# Patient Record
Sex: Female | Born: 1990 | Race: White | Hispanic: No | Marital: Single | Smoking: Never smoker
Health system: Southern US, Community
[De-identification: ages and names within clinical notes are randomized; demographics above are authoritative.]

## PROBLEM LIST (undated history)

## (undated) DIAGNOSIS — O24419 Gestational diabetes mellitus in pregnancy, unspecified control: Secondary | ICD-10-CM

## (undated) HISTORY — PX: NO PAST SURGERIES: SHX2092

## (undated) HISTORY — DX: Gestational diabetes mellitus in pregnancy, unspecified control: O24.419

---

## 2012-10-22 DIAGNOSIS — O24419 Gestational diabetes mellitus in pregnancy, unspecified control: Secondary | ICD-10-CM

## 2013-06-16 ENCOUNTER — Encounter (INDEPENDENT_AMBULATORY_CARE_PROVIDER_SITE_OTHER): Payer: Self-pay

## 2013-06-16 ENCOUNTER — Ambulatory Visit (INDEPENDENT_AMBULATORY_CARE_PROVIDER_SITE_OTHER): Payer: Medicaid Other

## 2013-06-16 ENCOUNTER — Other Ambulatory Visit: Payer: Self-pay | Admitting: Obstetrics & Gynecology

## 2013-06-16 ENCOUNTER — Other Ambulatory Visit: Payer: Medicaid Other

## 2013-06-16 DIAGNOSIS — O9989 Other specified diseases and conditions complicating pregnancy, childbirth and the puerperium: Secondary | ICD-10-CM

## 2013-06-16 DIAGNOSIS — Z36 Encounter for antenatal screening of mother: Secondary | ICD-10-CM

## 2013-06-16 DIAGNOSIS — N926 Irregular menstruation, unspecified: Secondary | ICD-10-CM

## 2013-06-16 DIAGNOSIS — O3680X0 Pregnancy with inconclusive fetal viability, not applicable or unspecified: Secondary | ICD-10-CM

## 2013-06-16 DIAGNOSIS — O99891 Other specified diseases and conditions complicating pregnancy: Secondary | ICD-10-CM

## 2013-06-16 DIAGNOSIS — Z3400 Encounter for supervision of normal first pregnancy, unspecified trimester: Secondary | ICD-10-CM

## 2013-06-16 NOTE — Progress Notes (Signed)
U/S-single IUP with +FCA noted, FHR-164 bpm, CRL c/w 12/1wks EDD 12/28/2013, cx appears closed, anterior Gr 0 placenta, bilateral adnexa appears WNL, pt desires NT/IT, NB present, NT-1.2941mm

## 2013-06-19 LAB — MATERNAL SCREEN, INTEGRATED #1

## 2013-06-24 ENCOUNTER — Encounter: Payer: Medicaid Other | Admitting: Adult Health

## 2013-07-01 ENCOUNTER — Encounter: Payer: Medicaid Other | Admitting: Women's Health

## 2013-12-29 DIAGNOSIS — O24419 Gestational diabetes mellitus in pregnancy, unspecified control: Secondary | ICD-10-CM

## 2014-04-21 ENCOUNTER — Encounter (HOSPITAL_COMMUNITY): Payer: Self-pay | Admitting: *Deleted

## 2015-10-24 ENCOUNTER — Encounter (HOSPITAL_COMMUNITY): Payer: Self-pay | Admitting: Emergency Medicine

## 2015-10-24 ENCOUNTER — Emergency Department (HOSPITAL_COMMUNITY)
Admission: EM | Admit: 2015-10-24 | Discharge: 2015-10-24 | Disposition: A | Discharge status: Home / Self Care | Payer: Medicaid Other | Attending: Emergency Medicine | Admitting: Emergency Medicine

## 2015-10-24 DIAGNOSIS — L0201 Cutaneous abscess of face: Secondary | ICD-10-CM | POA: Insufficient documentation

## 2015-10-24 DIAGNOSIS — L0291 Cutaneous abscess, unspecified: Secondary | ICD-10-CM

## 2015-10-24 MED ORDER — TRAMADOL HCL 50 MG PO TABS: 50.0000 mg | ORAL_TABLET | Freq: Four times a day (QID) | ORAL | 0 refills | Status: DC | PRN

## 2015-10-24 MED ORDER — SULFAMETHOXAZOLE-TRIMETHOPRIM 800-160 MG PO TABS: 1.0000 | ORAL_TABLET | Freq: Two times a day (BID) | ORAL | 0 refills | Status: AC

## 2015-10-24 NOTE — ED Provider Notes (Signed)
[facility]-EM[facility] DEPT Provider Note   CSN: [id number] Arrival date & time: 10/24/15  1738  First Provider Contact:  First MD Initiated Contact with Patient 10/24/15 1901      By signing my name below, I, [facility], attest that this documentation has been prepared under the direction and in the presence of Burgess Amor, PA-C. Electronically Signed: Randell Patient, ED Scribe. 10/24/15. 7:13 PM.   History   Chief Complaint Chief Complaint  Patient presents with  . Insect Bite    HPI Diane Medina is a 25 y.o. female who presents to the Emergency Department complaining of constant, gradually increasing in redness and swelling, moderately painful abscess just below her left lower lip after a presumptive spider bite that occurred 2 weeks ago. Pt states that she woke a couple weeks ago with a small, erythematous bite to her face for which she was evaluated at [facility] last week, diagnosed with a spider bite that had abscessed, and prescribed a 7-day course of clindamycin. She notes that she finished this course of medication and that over the past week the bump has increased in size, redness, and pain. She reports intermittent HAs and nausea for the past two weeks but not currently. Denies fever or abdominal pain.  The history is provided by the patient. No language interpreter was used.    History reviewed. No pertinent past medical history.  There are no active problems to display for this patient.   History reviewed. No pertinent surgical history.  OB History    Gravida Para Term Preterm AB Living   1             SAB TAB Ectopic Multiple Live Births                   Home Medications    Prior to Admission medications   Medication Sig Start Date End Date Taking? Authorizing Provider  sulfamethoxazole-trimethoprim (BACTRIM DS,SEPTRA DS) 800-160 MG tablet Take 1 tablet by mouth 2 (two) times daily. 10/24/15 10/31/15  Burgess Amor, PA-C  traMADol (ULTRAM) 50 MG tablet  Take 1 tablet (50 mg total) by mouth every 6 (six) hours as needed. 10/24/15   Burgess Amor, PA-C    Family History History reviewed. No pertinent family history.  Social History Social History  Substance Use Topics  . Smoking status: Never Smoker  . Smokeless tobacco: Never Used  . Alcohol use No     Allergies   Penicillins   Review of Systems Review of Systems   Physical Exam Updated Vital Signs BP 107/64 (BP Location: Left Arm)   Pulse 95   Temp 98.4 F (36.9 C) (Oral)   Resp 18   Ht 5\' 6"  (1.676 m)   Wt 113.4 kg   LMP 10/17/2015   SpO2 100%   BMI 40.35 kg/m   Physical Exam  Constitutional: She is oriented to person, place, and time. She appears well-developed and well-nourished. No distress.  HENT:  Head: Normocephalic and atraumatic.  0.75 cm raised pustule beneath her left lower lip. No drainage. No red streaking.  Eyes: Conjunctivae are normal.  Neck: Normal range of motion.  Cardiovascular: Normal rate.   Pulmonary/Chest: Effort normal. No respiratory distress.  Musculoskeletal: Normal range of motion.  Neurological: She is alert and oriented to person, place, and time.  Skin: Skin is warm and dry.  Psychiatric: She has a normal mood and affect. Her behavior is normal.  Nursing note and vitals reviewed.    ED Treatments /  Results  DIAGNOSTIC STUDIES: Oxygen Saturation is 100% on RA, normal by my interpretation.    COORDINATION OF CARE: 7:02 PM Will apply freeze spray and needle aspiration to pustule. Discussed treatment plan with pt at bedside and pt agreed to plan.    Procedures Procedures   INCISION AND DRAINAGE Performed by: Burgess Amor Consent: Verbal consent obtained. Risks and benefits: risks, benefits and alternatives were discussed Type: abscess  Body area:left chin/lower lip Anesthesia: Gebauers Freeze spray  Needle aspiration using #21 gauge needle  Local anesthetic: none Anesthetic total: n/a Complexity: simple Drainage:  purulent  Drainage amount: trace  Packing material: n/a  Patient tolerance: Patient tolerated the procedure well with no immediate complications.     Medications Ordered in ED Medications - No data to display   Initial Impression / Assessment and Plan / ED Course  I have reviewed the triage vital signs and the nursing notes.  Pertinent labs & imaging results that were available during my care of the patient were reviewed by me and considered in my medical decision making (see chart for details).  Clinical Course    Advised to avoid squeezing the site.  Apply warm compresses, bactrim, tramadol prescribed.  Plan f/u with pcp or return here for any worsening conditions.  Findings limited to small pustule/ papular skin infection.    Final Clinical Impressions(s) / ED Diagnoses   Final diagnoses:  Abscess    New Prescriptions Discharge Medication List as of 10/24/2015  7:36 PM    START taking these medications   Details  sulfamethoxazole-trimethoprim (BACTRIM DS,SEPTRA DS) 800-160 MG tablet Take 1 tablet by mouth 2 (two) times daily., Starting Mon 10/24/2015, Until Mon 10/31/2015, Print    traMADol (ULTRAM) 50 MG tablet Take 1 tablet (50 mg total) by mouth every 6 (six) hours as needed., Starting Mon 10/24/2015, Print       I personally performed the services described in this documentation, which was scribed in my presence. The recorded information has been reviewed and is accurate.    Burgess Amor, PA-C 10/26/15 2302    Glynn Octave, MD 10/28/15 [phone]

## 2015-10-24 NOTE — ED Notes (Signed)
Pt verbalized understanding of no driving and to use caution within 4 hours of taking pain meds due to meds cause drowsiness 

## 2015-10-24 NOTE — ED Triage Notes (Signed)
Pt reports was diagnosed with a spider bite and reports was prescribed abx. Pt reports finished abx last Friday. Pt reports site is "swelling more towards my lip." nad noted. Small red raised area noted to lower left side of face. nad noted.

## 2015-10-24 NOTE — Discharge Instructions (Signed)
Continue using warm water soaks as discussed.  Gentle massage around the site can also be helpful while soaking.  Avoid squeezing the site.  You may take the tramadol prescribed for pain relief.  This will make you drowsy - do not drive within 4 hours of taking this medication.

## 2015-12-07 ENCOUNTER — Encounter (HOSPITAL_COMMUNITY): Payer: Self-pay

## 2015-12-07 ENCOUNTER — Emergency Department (HOSPITAL_COMMUNITY)
Admission: EM | Admit: 2015-12-07 | Discharge: 2015-12-07 | Disposition: A | Discharge status: Home / Self Care | Payer: Medicaid Other | Attending: Emergency Medicine | Admitting: Emergency Medicine

## 2015-12-07 ENCOUNTER — Emergency Department (HOSPITAL_COMMUNITY): Payer: Medicaid Other

## 2015-12-07 DIAGNOSIS — Y929 Unspecified place or not applicable: Secondary | ICD-10-CM | POA: Insufficient documentation

## 2015-12-07 DIAGNOSIS — W182XXA Fall in (into) shower or empty bathtub, initial encounter: Secondary | ICD-10-CM | POA: Insufficient documentation

## 2015-12-07 DIAGNOSIS — Y999 Unspecified external cause status: Secondary | ICD-10-CM | POA: Insufficient documentation

## 2015-12-07 DIAGNOSIS — Y939 Activity, unspecified: Secondary | ICD-10-CM | POA: Insufficient documentation

## 2015-12-07 DIAGNOSIS — Z79899 Other long term (current) drug therapy: Secondary | ICD-10-CM | POA: Insufficient documentation

## 2015-12-07 DIAGNOSIS — S60211A Contusion of right wrist, initial encounter: Secondary | ICD-10-CM | POA: Insufficient documentation

## 2015-12-07 DIAGNOSIS — M25531 Pain in right wrist: Secondary | ICD-10-CM

## 2015-12-07 NOTE — ED Triage Notes (Signed)
Slipped and fell in the bathroom this morning and hit my right wrist.  Having pain since this morning and it keeps getting worse.  Any movement or vibration makes it hurt.

## 2015-12-07 NOTE — ED Provider Notes (Signed)
[facility] Provider Note   CSN: [id number] Arrival date & time: 12/07/15  1925     History   Chief Complaint Chief Complaint  Patient presents with  . Wrist Pain    HPI Diane Medina is a 25 y.o. female.  HPI   Patient is a 25 year old female who presents the emergency department with progressively worsening right wrist pain that began this morning around 9 AM after she struck her wrist on her tub. She states constant throbbing pain, 6/10, worse with movement, 8/10. Associated intermittent numbness of her fifth finger and bruising. She has not taken anything for pain. Patient denies fever, weakness.  History reviewed. No pertinent past medical history.  There are no active problems to display for this patient.   History reviewed. No pertinent surgical history.  OB History    Gravida Para Term Preterm AB Living   1             SAB TAB Ectopic Multiple Live Births                   Home Medications    Prior to Admission medications   Medication Sig Start Date End Date Taking? Authorizing Provider  traMADol (ULTRAM) 50 MG tablet Take 1 tablet (50 mg total) by mouth every 6 (six) hours as needed. 10/24/15   Burgess Amor, PA-C    Family History History reviewed. No pertinent family history.  Social History Social History  Substance Use Topics  . Smoking status: Never Smoker  . Smokeless tobacco: Never Used  . Alcohol use No     Allergies   Penicillins   Review of Systems Review of Systems  Constitutional: Negative for fever.  Skin: Positive for color change. Negative for wound.  Neurological: Positive for numbness. Negative for weakness.     Physical Exam Updated Vital Signs BP 115/71 (BP Location: Left Arm)   Pulse 86   Temp 98.4 F (36.9 C) (Oral)   Resp 20   Ht 5\' 5"  (1.651 m)   Wt 108.9 kg   LMP 11/18/2015 (Approximate)   SpO2 100%   Breastfeeding? No   BMI 39.94 kg/m   Physical Exam  Constitutional: She appears well-developed  and well-nourished. No distress.  HENT:  Head: Normocephalic and atraumatic.  Eyes: Conjunctivae are normal.  Cardiovascular:  Pulses:      Radial pulses are 2+ on the right side, and 2+ on the left side.  Pulmonary/Chest: Effort normal. No respiratory distress.  Musculoskeletal: Normal range of motion.  contusions noted to lateral right wrist, no deformity, mild edema. Limited range of motion due to pain. TTP of the lateral wrist. Full range of motion of fingers. Sensation intact. Patient is neurovascularly intact distally.  Neurological: She is alert. Coordination normal.  Skin: Skin is warm and dry. She is not diaphoretic.  Psychiatric: She has a normal mood and affect. Her behavior is normal.  Nursing note and vitals reviewed.    ED Treatments / Results  Labs (all labs ordered are listed, but only abnormal results are displayed) Labs Reviewed - No data to display  EKG  EKG Interpretation None       Radiology Dg Wrist Complete Right  Result Date: 12/07/2015 CLINICAL DATA:  Fall in shower with wrist pain, initial encounter EXAM: RIGHT WRIST - COMPLETE 3+ VIEW COMPARISON:  None. FINDINGS: There is no evidence of fracture or dislocation. There is no evidence of arthropathy or other focal bone abnormality. Soft tissues are unremarkable. IMPRESSION: No  acute abnormality noted. Electronically Signed   By: Alcide CleverMark  Lukens M.D.   On: 12/07/2015 19:55    Procedures Procedures (including critical care time)  Medications Ordered in ED Medications - No data to display   Initial Impression / Assessment and Plan / ED Course  I have reviewed the triage vital signs and the nursing notes.  Pertinent labs & imaging results that were available during my care of the patient were reviewed by me and considered in my medical decision making (see chart for details).  Clinical Course   Patient with right wrist pain. Presentation concerning for contusion. X-rays reviewed by me revealed no bony  abnormality, dislocation, or fracture. Patient is neurovascularly intact distally and able to move right wrist although states it is painful. Patient given wrist brace in the ED. Discussed RICE and pain medication. Instructed patient to follow up with their primary care provider within 2-3 days to have wrist reevaluated. Discussed strict return precautions to the ED. patient appears reliable and expressed understanding to the discharge instructions.    Final Clinical Impressions(s) / ED Diagnoses   Final diagnoses:  Right wrist pain  Contusion, wrist, right, initial encounter    New Prescriptions New Prescriptions   No medications on file     Jerre SimonJessica L Focht, [place] 12/07/15 2014 Medical screening examination/treatment/procedure(s) were performed by non-physician practitioner and as supervising physician I was immediately available for consultation/collaboration.   EKG Interpretation None        Donnetta HutchingBrian Wendell Fiebig, MD 12/08/15 1512

## 2015-12-07 NOTE — Discharge Instructions (Signed)
Use ice on your wrist as often as you can, 20 minutes on, 20 minutes off. Take ibuprofen as needed for pain and be sure to eat with this medication. Wear the wrist brace for comfort for 3-4 days. You may take the brace off to shower. Follow up with the [facility] in 3-4 days if your symptoms are not improving. Return to the emergency department if you experience worsening numbness, weakness, increased swelling, increased pain, fever or any other concerning symptoms.

## 2018-08-21 ENCOUNTER — Emergency Department (HOSPITAL_COMMUNITY): Payer: Self-pay

## 2018-08-21 ENCOUNTER — Encounter (HOSPITAL_COMMUNITY): Payer: Self-pay

## 2018-08-21 ENCOUNTER — Other Ambulatory Visit: Payer: Self-pay

## 2018-08-21 ENCOUNTER — Emergency Department (HOSPITAL_COMMUNITY)
Admission: EM | Admit: 2018-08-21 | Discharge: 2018-08-22 | Disposition: A | Discharge status: Home / Self Care | Payer: Self-pay | Attending: Emergency Medicine | Admitting: Emergency Medicine

## 2018-08-21 DIAGNOSIS — S51812A Laceration without foreign body of left forearm, initial encounter: Secondary | ICD-10-CM | POA: Insufficient documentation

## 2018-08-21 DIAGNOSIS — W540XXA Bitten by dog, initial encounter: Secondary | ICD-10-CM | POA: Insufficient documentation

## 2018-08-21 DIAGNOSIS — Y999 Unspecified external cause status: Secondary | ICD-10-CM | POA: Insufficient documentation

## 2018-08-21 DIAGNOSIS — R202 Paresthesia of skin: Secondary | ICD-10-CM | POA: Insufficient documentation

## 2018-08-21 DIAGNOSIS — Y92009 Unspecified place in unspecified non-institutional (private) residence as the place of occurrence of the external cause: Secondary | ICD-10-CM | POA: Insufficient documentation

## 2018-08-21 DIAGNOSIS — Y9389 Activity, other specified: Secondary | ICD-10-CM | POA: Insufficient documentation

## 2018-08-21 DIAGNOSIS — Z23 Encounter for immunization: Secondary | ICD-10-CM | POA: Insufficient documentation

## 2018-08-21 MED ORDER — LIDOCAINE-EPINEPHRINE 2 %-1:100000 IJ SOLN
20.0000 mL | Freq: Once | INTRAMUSCULAR | Status: DC
Start: 2018-08-21 — End: 2018-08-22
  Filled 2018-08-21: qty 20

## 2018-08-21 MED ORDER — METRONIDAZOLE 500 MG PO TABS
500.0000 mg | ORAL_TABLET | Freq: Once | ORAL | Status: AC
  Administered 2018-08-21: 500 mg via ORAL
  Filled 2018-08-21: qty 1

## 2018-08-21 MED ORDER — OXYCODONE-ACETAMINOPHEN 5-325 MG PO TABS
1.0000 | ORAL_TABLET | Freq: Once | ORAL | Status: AC
  Administered 2018-08-21: 1 via ORAL
  Filled 2018-08-21: qty 1

## 2018-08-21 MED ORDER — DOXYCYCLINE HYCLATE 100 MG PO TABS
100.0000 mg | ORAL_TABLET | Freq: Once | ORAL | Status: AC
  Administered 2018-08-21: 100 mg via ORAL
  Filled 2018-08-21: qty 1

## 2018-08-21 MED ORDER — LIDOCAINE-EPINEPHRINE (PF) 1 %-1:200000 IJ SOLN
INTRAMUSCULAR | Status: AC
  Filled 2018-08-21: qty 30

## 2018-08-21 NOTE — ED Notes (Signed)
Called ac `

## 2018-08-21 NOTE — ED Triage Notes (Signed)
Pt presents to ED with dog bite to left forearm. Pt with multiple lacerations, bleeding well controlled. Pt states dog was a friend's dog and has had vaccines. Pt has already contacted animal control

## 2018-08-22 MED ORDER — TETANUS-DIPHTH-ACELL PERTUSSIS 5-2.5-18.5 LF-MCG/0.5 IM SUSP
0.5000 mL | Freq: Once | INTRAMUSCULAR | Status: AC
  Administered 2018-08-22: 0.5 mL via INTRAMUSCULAR
  Filled 2018-08-22: qty 0.5

## 2018-08-22 MED ORDER — OXYCODONE-ACETAMINOPHEN 5-325 MG PO TABS: 1.0000 | ORAL_TABLET | ORAL | 0 refills | Status: DC | PRN

## 2018-08-22 MED ORDER — DOXYCYCLINE HYCLATE 100 MG PO CAPS: 100.0000 mg | ORAL_CAPSULE | Freq: Two times a day (BID) | ORAL | 0 refills | Status: AC

## 2018-08-22 MED ORDER — METRONIDAZOLE 500 MG PO TABS: 500.0000 mg | ORAL_TABLET | Freq: Two times a day (BID) | ORAL | 0 refills | Status: AC

## 2018-08-22 NOTE — ED Provider Notes (Signed)
Emergency Department Provider Note   I have reviewed the triage vital signs and the nursing notes.   HISTORY  Chief Complaint Animal Bite   HPI Diane Medina is a 28 y.o. female who walked into a room and scared her dog which subsequently bit her on the arm leaving her with 5 lacerations, swelling and bruising to the left arm.  No injuries elsewhere.  Did not fall and hurt herself.  Had a rabies up dated about 7 years ago.  This dog was at a friend's house and she talked to animal control who is observing the dog at this time.  Patient is otherwise asymptomatic-some tingling in her fingertips.  Her tetanus is out of date in a couple months.   No other associated or modifying symptoms.    History reviewed. No pertinent past medical history.  There are no active problems to display for this patient.   History reviewed. No pertinent surgical history.  Current Outpatient Rx  . Order #: [id number] Class: Print  . Order #: [id number] Class: Print  . Order #: [id number] Class: Print  . Order #: [id number] Class: Print  . Order #: [id number] Class: Print    Allergies Penicillins  No family history on file.  Social History Social History   Tobacco Use  . Smoking status: Never Smoker  . Smokeless tobacco: Never Used  Substance Use Topics  . Alcohol use: No  . Drug use: No    Review of Systems  All other systems negative except as documented in the HPI. All pertinent positives and negatives as reviewed in the HPI. ____________________________________________   PHYSICAL EXAM:  VITAL SIGNS: ED Triage Vitals [08/21/18 2149]  Enc Vitals Group     BP 126/72     Pulse Rate 69     Resp 18     Temp 98.2 F (36.8 C)     Temp Source Oral     SpO2 100 %     Weight 215 lb (97.5 kg)     Height 5\' 5"  (1.651 m)     Head Circumference      Peak Flow      Pain Score 9     Pain Loc      Pain Edu?      Excl. in GC?     Constitutional: Alert and oriented. Well appearing and  in no acute distress. Eyes: Conjunctivae are normal. PERRL. EOMI. Head: Atraumatic. Nose: No congestion/rhinnorhea. Mouth/Throat: Mucous membranes are moist.  Oropharynx non-erythematous. Neck: No stridor.  No meningeal signs.   Cardiovascular: Normal rate, regular rhythm. Good peripheral circulation. Grossly normal heart sounds.   Respiratory: Normal respiratory effort.  No retractions. Lungs CTAB. Gastrointestinal: Soft and nontender. No distention.  Musculoskeletal: No lower extremity tenderness nor edema. No gross deformities of extremities. Neurologic:  Normal speech and language. No gross focal neurologic deficits are appreciated.  Skin:  Skin is warm, dry. No rash noted.  She has 5 lacerations.  They are all about the proximal half of her left forearm on the dorsal and volar surfaces.  3 of them are approximately 1 cm with minimal adipose tissue showing and relatively straight.  She has 2 other superficial ones that are approximately three quarters of a centimeter.  She has 2 significant areas of ecchymosis on her left forearm as well.  Her left arm is more swollen than her right but all palpable compartments are soft.   ____________________________________________   RADIOLOGY  Dg Forearm Left  Result Date: 08/21/2018 CLINICAL DATA:  28 year old female status post dog bite to the forearm. EXAM: LEFT FOREARM - 2 VIEW COMPARISON:  None. FINDINGS: Ventral and radial aspect subcutaneous soft tissue injury with stranding and trace gas. No radiopaque foreign body identified. Normal underlying osseous structures. Normal joint spaces and alignment. IMPRESSION: Soft tissue injury with no osseous abnormality or radiopaque foreign body identified. Electronically Signed   By: Odessa FlemingH  Hall M.D.   On: 08/21/2018 23:43    ____________________________________________   PROCEDURES  Procedure(s) performed:   Marland Kitchen.Marland Kitchen.Laceration Repair Date/Time: 08/22/2018 1:56 AM Performed by: Marily MemosMesner, Braidyn Scorsone, MD  Authorized by: Marily MemosMesner, Alsie Younes, MD   Consent:    Consent obtained:  Verbal   Consent given by:  Patient   Risks discussed:  Infection, need for additional repair, nerve damage, poor wound healing, poor cosmetic result, pain, retained foreign body, tendon damage and vascular damage   Alternatives discussed:  No treatment, delayed treatment and observation Anesthesia (see MAR for exact dosages):    Anesthesia method:  Local infiltration   Local anesthetic:  Lidocaine 1% w/o epi and lidocaine 1% WITH epi Laceration details:    Location:  Shoulder/arm   Shoulder/arm location:  L lower arm   Length (cm):  1   Depth (mm):  2 Repair type:    Repair type:  Simple Pre-procedure details:    Preparation:  Patient was prepped and draped in usual sterile fashion and imaging obtained to evaluate for foreign bodies Exploration:    Wound exploration: wound explored through full range of motion and entire depth of wound probed and visualized     Contaminated: no   Treatment:    Area cleansed with:  Saline   Amount of cleaning:  Extensive   Irrigation solution:  Sterile water   Irrigation volume:  50   Irrigation method:  Syringe   Visualized foreign bodies/material removed: no   Skin repair:    Repair method:  Sutures   Suture size:  3-0   Suture material:  Prolene   Suture technique:  Simple interrupted   Number of sutures:  3 Approximation:    Approximation:  Loose Post-procedure details:    Dressing:  Non-adherent dressing and tube gauze   Patient tolerance of procedure:  Tolerated well, no immediate complications .Marland Kitchen.Laceration Repair Date/Time: 08/22/2018 1:59 AM Performed by: Marily MemosMesner, Hamp Moreland, MD Authorized by: Marily MemosMesner, Lyon Dumont, MD   Consent:    Consent obtained:  Verbal   Consent given by:  Patient   Risks discussed:  Infection, need for additional repair, nerve damage, poor wound healing, poor cosmetic result, pain, retained foreign body, tendon damage and vascular damage   Alternatives  discussed:  No treatment, delayed treatment and observation Anesthesia (see MAR for exact dosages):    Anesthesia method:  Local infiltration   Local anesthetic:  Lidocaine 1% w/o epi and lidocaine 1% WITH epi Laceration details:    Location:  Shoulder/arm   Shoulder/arm location:  L lower arm   Length (cm):  1   Depth (mm):  2 Repair type:    Repair type:  Simple Pre-procedure details:    Preparation:  Patient was prepped and draped in usual sterile fashion and imaging obtained to evaluate for foreign bodies Exploration:    Wound exploration: wound explored through full range of motion and entire depth of wound probed and visualized     Contaminated: no   Treatment:    Area cleansed with:  Saline   Amount of cleaning:  Extensive   Irrigation solution:  Sterile water  Irrigation volume:  50   Irrigation method:  Syringe   Visualized foreign bodies/material removed: no   Skin repair:    Repair method:  Sutures   Suture size:  3-0   Suture material:  Prolene   Suture technique:  Simple interrupted   Number of sutures:  2 Approximation:    Approximation:  Loose Post-procedure details:    Dressing:  Non-adherent dressing and bulky dressing   Patient tolerance of procedure:  Tolerated well, no immediate complications .Marland KitchenLaceration Repair Date/Time: 08/22/2018 2:00 AM Performed by: Marily Memos, MD Authorized by: Marily Memos, MD   Consent:    Consent obtained:  Verbal   Consent given by:  Patient   Risks discussed:  Infection, need for additional repair, nerve damage, poor wound healing, poor cosmetic result, pain, retained foreign body, tendon damage and vascular damage   Alternatives discussed:  No treatment, delayed treatment and observation Anesthesia (see MAR for exact dosages):    Anesthesia method:  Local infiltration   Local anesthetic:  Lidocaine 1% WITH epi Laceration details:    Location:  Shoulder/arm   Shoulder/arm location:  L lower arm   Length (cm):  1    Depth (mm):  2 Repair type:    Repair type:  Simple Pre-procedure details:    Preparation:  Patient was prepped and draped in usual sterile fashion and imaging obtained to evaluate for foreign bodies Exploration:    Wound exploration: wound explored through full range of motion and entire depth of wound probed and visualized   Treatment:    Area cleansed with:  Saline   Amount of cleaning:  Extensive   Irrigation solution:  Sterile water   Irrigation volume:  50   Irrigation method:  Syringe   Visualized foreign bodies/material removed: no   Skin repair:    Repair method:  Sutures   Suture size:  3-0   Suture material:  Prolene   Suture technique:  Simple interrupted   Number of sutures:  2 Approximation:    Approximation:  Close Post-procedure details:    Dressing:  Antibiotic ointment, non-adherent dressing and tube gauze   Patient tolerance of procedure:  Tolerated well, no immediate complications  ____________________________________________   INITIAL IMPRESSION / ASSESSMENT AND PLAN / ED COURSE  Patient definitely has significant soft tissue damage secondary to crush injury.  This happened approximately 9 hours prior to her being evaluated so I do not think is can get much worse.  She has mild tingling in her fingers but no other evidence of compartment syndrome.  She is neurologically intact.  The pain is controlled aside from pressure on her forearm.  No obvious fracture or foreign bodies on x-ray.  Wounds all cleaned and dressed with loose approximation of the relatively superficial lacerations.  After probing them the did not seem very deep however because it was a canine bite they were loosely approximated and started on antibiotics after significant cleaning and repair.  Discussed not repairing them, however she accepts the risk of infection with the benefit of having better cosmesis.  It is her lower forearms with a gets less likelihood of infection secondary to increased  blood supply.  Also discussed that she could come back in a few days for wound check.  Discussed signs of compartment syndrome with her and she will return for those as well.  Otherwise return in a week for suture removal.  Pertinent labs & imaging results that were available during my care of the patient were  reviewed by me and considered in my medical decision making (see chart for details).  A medical screening exam was performed and I feel the patient has had an appropriate workup for their chief complaint at this time and likelihood of emergent condition existing is low. They have been counseled on decision, discharge, follow up and which symptoms necessitate immediate return to the emergency department. They or their family verbally stated understanding and agreement with plan and discharged in stable condition.   ____________________________________________  FINAL CLINICAL IMPRESSION(S) / ED DIAGNOSES  Final diagnoses:  Dog bite, initial encounter     MEDICATIONS GIVEN DURING THIS VISIT:  Medications  lidocaine-EPINEPHrine (XYLOCAINE-EPINEPHrine) 1 %-1:200000 (PF) injection (has no administration in time range)  doxycycline (VIBRA-TABS) tablet 100 mg (100 mg Oral Given 08/21/18 2340)  metroNIDAZOLE (FLAGYL) tablet 500 mg (500 mg Oral Given 08/21/18 2340)  oxyCODONE-acetaminophen (PERCOCET/ROXICET) 5-325 MG per tablet 1 tablet (1 tablet Oral Given 08/21/18 2340)  Tdap (BOOSTRIX) injection 0.5 mL (0.5 mLs Intramuscular Given 08/22/18 0049)     NEW OUTPATIENT MEDICATIONS STARTED DURING THIS VISIT:  Discharge Medication List as of 08/22/2018 12:53 AM    START taking these medications   Details  doxycycline (VIBRAMYCIN) 100 MG capsule Take 1 capsule (100 mg total) by mouth 2 (two) times daily for 10 days., Starting Fri 08/22/2018, Until Mon 09/01/2018, Print    metroNIDAZOLE (FLAGYL) 500 MG tablet Take 1 tablet (500 mg total) by mouth 2 (two) times daily for 10 days., Starting Fri  08/22/2018, Until Mon 09/01/2018, Print    !! oxyCODONE-acetaminophen (PERCOCET) 5-325 MG tablet Take 1-2 tablets by mouth every 4 (four) hours as needed., Starting Fri 08/22/2018, Print    !! oxyCODONE-acetaminophen (PERCOCET) 5-325 MG tablet Take 1-2 tablets by mouth every 4 (four) hours as needed for severe pain., Starting Fri 08/22/2018, Print     !! - Potential duplicate medications found. Please discuss with provider.      Note:  This note was prepared with assistance of Dragon voice recognition software. Occasional wrong-word or sound-a-like substitutions may have occurred due to the inherent limitations of voice recognition software.   Curlie Sittner, Barbara Cower, MD 08/22/18 [phone]

## 2018-08-22 NOTE — ED Notes (Signed)
Pt given prepack  

## 2018-08-26 MED FILL — Hydrocodone-Acetaminophen Tab 5-325 MG: ORAL | Qty: 6 | Status: AC

## 2018-10-27 ENCOUNTER — Emergency Department (HOSPITAL_COMMUNITY)
Admission: EM | Admit: 2018-10-27 | Discharge: 2018-10-27 | Disposition: A | Discharge status: Home / Self Care | Payer: Medicaid Other | Attending: Emergency Medicine | Admitting: Emergency Medicine

## 2018-10-27 ENCOUNTER — Other Ambulatory Visit: Payer: Self-pay

## 2018-10-27 ENCOUNTER — Encounter (HOSPITAL_COMMUNITY): Payer: Self-pay | Admitting: Emergency Medicine

## 2018-10-27 DIAGNOSIS — R82998 Other abnormal findings in urine: Secondary | ICD-10-CM | POA: Insufficient documentation

## 2018-10-27 DIAGNOSIS — R42 Dizziness and giddiness: Secondary | ICD-10-CM | POA: Insufficient documentation

## 2018-10-27 DIAGNOSIS — R55 Syncope and collapse: Secondary | ICD-10-CM | POA: Insufficient documentation

## 2018-10-27 DIAGNOSIS — Z3A01 Less than 8 weeks gestation of pregnancy: Secondary | ICD-10-CM | POA: Insufficient documentation

## 2018-10-27 DIAGNOSIS — O9989 Other specified diseases and conditions complicating pregnancy, childbirth and the puerperium: Secondary | ICD-10-CM | POA: Insufficient documentation

## 2018-10-27 DIAGNOSIS — O99281 Endocrine, nutritional and metabolic diseases complicating pregnancy, first trimester: Secondary | ICD-10-CM | POA: Insufficient documentation

## 2018-10-27 DIAGNOSIS — O99351 Diseases of the nervous system complicating pregnancy, first trimester: Secondary | ICD-10-CM | POA: Diagnosis present

## 2018-10-27 DIAGNOSIS — E860 Dehydration: Secondary | ICD-10-CM | POA: Diagnosis not present

## 2018-10-27 LAB — URINALYSIS, ROUTINE W REFLEX MICROSCOPIC
Bilirubin Urine: NEGATIVE
Glucose, UA: NEGATIVE mg/dL
Hgb urine dipstick: NEGATIVE
Ketones, ur: NEGATIVE mg/dL
Nitrite: NEGATIVE
Protein, ur: NEGATIVE mg/dL
Specific Gravity, Urine: 1.029 (ref 1.005–1.030)
pH: 6 (ref 5.0–8.0)

## 2018-10-27 LAB — CBC WITH DIFFERENTIAL/PLATELET
Abs Immature Granulocytes: 0.02 10*3/uL (ref 0.00–0.07)
Basophils Absolute: 0 10*3/uL (ref 0.0–0.1)
Basophils Relative: 0 %
Eosinophils Absolute: 0 10*3/uL (ref 0.0–0.5)
Eosinophils Relative: 0 %
HCT: 37 % (ref 36.0–46.0)
Hemoglobin: 12.1 g/dL (ref 12.0–15.0)
Immature Granulocytes: 0 %
Lymphocytes Relative: 19 %
Lymphs Abs: 1.6 10*3/uL (ref 0.7–4.0)
MCH: 26.5 pg (ref 26.0–34.0)
MCHC: 32.7 g/dL (ref 30.0–36.0)
MCV: 81 fL (ref 80.0–100.0)
Monocytes Absolute: 0.7 10*3/uL (ref 0.1–1.0)
Monocytes Relative: 9 %
Neutro Abs: 5.9 10*3/uL (ref 1.7–7.7)
Neutrophils Relative %: 72 %
Platelets: 197 10*3/uL (ref 150–400)
RBC: 4.57 MIL/uL (ref 3.87–5.11)
RDW: 13.6 % (ref 11.5–15.5)
WBC: 8.2 10*3/uL (ref 4.0–10.5)
nRBC: 0 % (ref 0.0–0.2)

## 2018-10-27 LAB — COMPREHENSIVE METABOLIC PANEL
ALT: 11 U/L (ref 0–44)
AST: 16 U/L (ref 15–41)
Albumin: 3.6 g/dL (ref 3.5–5.0)
Alkaline Phosphatase: 53 U/L (ref 38–126)
Anion gap: 7 (ref 5–15)
BUN: 13 mg/dL (ref 6–20)
CO2: 24 mmol/L (ref 22–32)
Calcium: 8.8 mg/dL — ABNORMAL LOW (ref 8.9–10.3)
Chloride: 105 mmol/L (ref 98–111)
Creatinine, Ser: 0.53 mg/dL (ref 0.44–1.00)
GFR calc Af Amer: >60 mL/min (ref >60)
GFR calc non Af Amer: >60 mL/min (ref >60)
Glucose, Bld: 94 mg/dL (ref 70–99)
Potassium: 3.9 mmol/L (ref 3.5–5.1)
Sodium: 136 mmol/L (ref 135–145)
Total Bilirubin: 0.9 mg/dL (ref 0.3–1.2)
Total Protein: 7.2 g/dL (ref 6.5–8.1)

## 2018-10-27 LAB — I-STAT BETA HCG BLOOD, ED (MC, WL, AP ONLY): I-stat hCG, quantitative: >2000 m[IU]/mL — ABNORMAL HIGH (ref <5)

## 2018-10-27 LAB — HCG, QUANTITATIVE, PREGNANCY: hCG, Beta Chain, Quant, S: 91404 m[IU]/mL — ABNORMAL HIGH (ref <5)

## 2018-10-27 MED ORDER — ACETAMINOPHEN 500 MG PO TABS
1000.0000 mg | ORAL_TABLET | Freq: Once | ORAL | Status: AC
  Administered 2018-10-27: 20:00:00 1000 mg via ORAL
  Filled 2018-10-27: qty 2

## 2018-10-27 MED ORDER — METOCLOPRAMIDE HCL 5 MG/ML IJ SOLN
10.0000 mg | Freq: Once | INTRAMUSCULAR | Status: AC
  Administered 2018-10-27: 19:00:00 10 mg via INTRAVENOUS
  Filled 2018-10-27: qty 2

## 2018-10-27 MED ORDER — SODIUM CHLORIDE 0.9 % IV BOLUS
1000.0000 mL | Freq: Once | INTRAVENOUS | Status: AC
  Administered 2018-10-27: 19:00:00 1000 mL via INTRAVENOUS

## 2018-10-27 NOTE — ED Provider Notes (Signed)
[facility] EMERGENCY DEPARTMENT Provider Note   CSN: [id number] Arrival date & time: 10/27/18  1843     History   Chief Complaint Chief Complaint  Patient presents with  . Dizziness    HPI Diane Medina is a 28 y.o. female.     Patient is a 28 year old female who presents to the emergency department with a complaint of dizziness.  The patient states that a little over an hour prior to her arrival she had an episode of nausea, feeling lightheaded.  This was then followed by 5 episodes of vomiting during the day today.  Patient states that she was told last week that she was probably between 6 and [redacted] weeks pregnant.  She says that this is her fourth pregnancy, she at times has problems with low blood sugar during her pregnancies.  She says that she had a fall and everything went black for a few seconds, she did not have to be revived, but woke up on her own.  She was also this was also accompanied by headache.  She says however she did not hit her head during this fall. No unusual vaginal bleeding.  No discomfort with urination, no blood in the stool, no recent changes in diet, and no recent changes in medication.  She presents now for evaluation and assistance with these issues.  The history is provided by the patient.    History reviewed. No pertinent past medical history.  There are no active problems to display for this patient.   History reviewed. No pertinent surgical history.   OB History    Gravida  4   Para  3   Term      Preterm      AB      Living        SAB      TAB      Ectopic      Multiple      Live Births               Home Medications    Prior to Admission medications   Medication Sig Start Date End Date Taking? Authorizing Provider  oxyCODONE-acetaminophen (PERCOCET) 5-325 MG tablet Take 1-2 tablets by mouth every 4 (four) hours as needed. 08/22/18   Mesner, Barbara CowerJason, MD  oxyCODONE-acetaminophen (PERCOCET) 5-325 MG tablet Take 1-2  tablets by mouth every 4 (four) hours as needed for severe pain. 08/22/18   Mesner, Barbara CowerJason, MD  traMADol (ULTRAM) 50 MG tablet Take 1 tablet (50 mg total) by mouth every 6 (six) hours as needed. 10/24/15   Burgess AmorIdol, Julie, PA-C    Family History History reviewed. No pertinent family history.  Social History Social History   Tobacco Use  . Smoking status: Never Smoker  . Smokeless tobacco: Never Used  Substance Use Topics  . Alcohol use: No  . Drug use: No     Allergies   Penicillins   Review of Systems Review of Systems  Constitutional: Negative for activity change and appetite change.  HENT: Negative for congestion, ear discharge, ear pain, facial swelling, nosebleeds, rhinorrhea, sneezing and tinnitus.   Eyes: Negative for photophobia, pain and discharge.  Respiratory: Negative for cough, choking, shortness of breath and wheezing.   Cardiovascular: Negative for chest pain, palpitations and leg swelling.  Gastrointestinal: Negative for abdominal pain, blood in stool, constipation, diarrhea, nausea and vomiting.  Genitourinary: Negative for difficulty urinating, dysuria, flank pain, frequency and hematuria.  Musculoskeletal: Negative for back pain, gait problem, myalgias and  neck pain.  Skin: Negative for color change, rash and wound.  Neurological: Positive for dizziness, light-headedness and headaches. Negative for seizures, syncope, facial asymmetry, speech difficulty, weakness and numbness.  Hematological: Negative for adenopathy. Does not bruise/bleed easily.  Psychiatric/Behavioral: Negative for agitation, confusion, hallucinations, self-injury and suicidal ideas. The patient is not nervous/anxious.      Physical Exam Updated Vital Signs BP (!) 111/54 (BP Location: Right Arm)   Pulse 85   Temp 98.8 F (37.1 C) (Oral)   Resp 18   Ht 5\' 5"  (1.651 m)   Wt 110 kg   LMP 09/01/2018   SpO2 99%   BMI 40.36 kg/m   Physical Exam Vitals signs and nursing note reviewed.   Constitutional:      Appearance: She is well-developed. She is not toxic-appearing.  HENT:     Head: Normocephalic.     Right Ear: Tympanic membrane and external ear normal.     Left Ear: Tympanic membrane and external ear normal.  Eyes:     General: Lids are normal.     Pupils: Pupils are equal, round, and reactive to light.  Neck:     Musculoskeletal: Normal range of motion and neck supple.     Vascular: No carotid bruit.  Cardiovascular:     Rate and Rhythm: Normal rate and regular rhythm.     Pulses: Normal pulses.     Heart sounds: Normal heart sounds.  Pulmonary:     Effort: No respiratory distress.     Breath sounds: Normal breath sounds.  Abdominal:     General: Bowel sounds are normal.     Palpations: Abdomen is soft.     Tenderness: There is no abdominal tenderness. There is no guarding.  Musculoskeletal: Normal range of motion.  Lymphadenopathy:     Head:     Right side of head: No submandibular adenopathy.     Left side of head: No submandibular adenopathy.     Cervical: No cervical adenopathy.  Skin:    General: Skin is warm and dry.  Neurological:     General: No focal deficit present.     Mental Status: She is alert and oriented to person, place, and time.     Cranial Nerves: No cranial nerve deficit.     Sensory: No sensory deficit.  Psychiatric:        Speech: Speech normal.      ED Treatments / Results  Labs (all labs ordered are listed, but only abnormal results are displayed) Labs Reviewed  I-STAT BETA HCG BLOOD, ED ([facility], [facility], [facility] ONLY) - Abnormal; Notable for the following components:      Result Value   I-stat hCG, quantitative >2,000.0 (*)    All other components within normal limits  CBC WITH DIFFERENTIAL/PLATELET  COMPREHENSIVE METABOLIC PANEL  URINALYSIS, ROUTINE W REFLEX MICROSCOPIC  HCG, QUANTITATIVE, PREGNANCY    EKG None  Radiology No results found.  Procedures Procedures (including critical care time)  Medications Ordered  in ED Medications  sodium chloride 0.9 % bolus 1,000 mL (1,000 mLs Intravenous New Bag/Given 10/27/18 1917)  metoCLOPramide (REGLAN) injection 10 mg (10 mg Intravenous Given 10/27/18 1922)     Initial Impression / Assessment and Plan / ED Course  I have reviewed the triage vital signs and the nursing notes.  Pertinent labs & imaging results that were available during my care of the patient were reviewed by me and considered in my medical decision making (see chart for details).  Final Clinical Impressions(s) / ED Diagnoses MDM  Vital signs reviewed.  Pulse oximetry is 99% on room air.  Within normal limits by my interpretation.  This is patient's fourth pregnancy.  She has had episodes of nausea, vomiting, lightheadedness, and near syncope.  Patient received IV fluids and IV Reglan.  Patient received Tylenol extra strength for her headache.  Urinalysis shows a hazy yellow specimen with a specific gravity of 1.029.  There is a small leukocyte esterase, with 6-10 white blood cells and rare bacteria.  A culture has been sent to the lab. Complete blood count is well within normal limits. Comprehensive metabolic panel is within normal limits.  The anion gap is normal at 7.  The beta quantitative hCG is 91,404, consistent with 5+ weeks.  Patient tolerated the IV fluids without problem.  Serial evaluation of vital signs were carried out with significant improvement at the time of discharge.  I have reviewed the findings with the patient in terms which he understands.  I have asked the patient to see her OB physician as soon as possible.  I have [facility] if her symptoms continue.  Patient is in agreement with this plan.   Final diagnoses:  Near syncope  Less than [redacted] weeks gestation of pregnancy  Dehydration    ED Discharge Orders    None       Ivery QualeBryant, Kinzi Frediani, Cordelia Poche-C 10/27/18 2239    Raeford RazorKohut, Stephen, MD 10/28/18 [phone]

## 2018-10-27 NOTE — Discharge Instructions (Addendum)
Your beta hCG was 91,404, consistent with about 5+ weeks of pregnancy.  Your urine tests question possible early urinary tract infection.  A culture has been sent to the lab.  Your examination and vital signs question mild dehydration.  You were given some IV fluids here in the department.  Please increase water and fluid intake.  Please see your OB physician as soon as possible for follow-up and recheck.  Please see the physicians at the [facility] if your symptoms worsen, or there are changes in your condition, problems, or concerns.

## 2018-10-27 NOTE — ED Triage Notes (Signed)
Pt reports finding out she was pregnant last week at approximately 10 weeks. Has not seen ob yet. Today became very nauseated this morning and has not kept anything down all day. Is dizzy and feels like her balance is off.

## 2018-10-29 LAB — URINE CULTURE: Special Requests: NORMAL

## 2018-11-15 DIAGNOSIS — O209 Hemorrhage in early pregnancy, unspecified: Secondary | ICD-10-CM | POA: Diagnosis not present

## 2018-11-15 DIAGNOSIS — Z880 Allergy status to penicillin: Secondary | ICD-10-CM | POA: Diagnosis not present

## 2018-11-15 DIAGNOSIS — Z3A09 9 weeks gestation of pregnancy: Secondary | ICD-10-CM | POA: Diagnosis not present

## 2018-11-15 DIAGNOSIS — Z3A10 10 weeks gestation of pregnancy: Secondary | ICD-10-CM | POA: Diagnosis not present

## 2018-11-18 DIAGNOSIS — Z0489 Encounter for examination and observation for other specified reasons: Secondary | ICD-10-CM | POA: Diagnosis not present

## 2018-11-18 DIAGNOSIS — Z5321 Procedure and treatment not carried out due to patient leaving prior to being seen by health care provider: Secondary | ICD-10-CM | POA: Diagnosis not present

## 2019-01-07 ENCOUNTER — Telehealth: Payer: Self-pay | Admitting: Advanced Practice Midwife

## 2019-01-07 ENCOUNTER — Other Ambulatory Visit: Payer: Self-pay | Admitting: Obstetrics & Gynecology

## 2019-01-07 DIAGNOSIS — Z363 Encounter for antenatal screening for malformations: Secondary | ICD-10-CM

## 2019-01-07 NOTE — Telephone Encounter (Signed)

## 2019-01-08 ENCOUNTER — Encounter: Payer: Self-pay | Admitting: Advanced Practice Midwife

## 2019-01-08 ENCOUNTER — Other Ambulatory Visit: Payer: Self-pay

## 2019-01-08 ENCOUNTER — Ambulatory Visit (INDEPENDENT_AMBULATORY_CARE_PROVIDER_SITE_OTHER): Payer: Medicaid Other

## 2019-01-08 ENCOUNTER — Ambulatory Visit (INDEPENDENT_AMBULATORY_CARE_PROVIDER_SITE_OTHER): Payer: Medicaid Other | Admitting: Advanced Practice Midwife

## 2019-01-08 ENCOUNTER — Ambulatory Visit: Payer: Medicaid Other | Admitting: *Deleted

## 2019-01-08 DIAGNOSIS — Z1389 Encounter for screening for other disorder: Secondary | ICD-10-CM

## 2019-01-08 DIAGNOSIS — Z0283 Encounter for blood-alcohol and blood-drug test: Secondary | ICD-10-CM

## 2019-01-08 DIAGNOSIS — O09292 Supervision of pregnancy with other poor reproductive or obstetric history, second trimester: Secondary | ICD-10-CM | POA: Diagnosis not present

## 2019-01-08 DIAGNOSIS — Z363 Encounter for antenatal screening for malformations: Secondary | ICD-10-CM | POA: Diagnosis not present

## 2019-01-08 DIAGNOSIS — Z113 Encounter for screening for infections with a predominantly sexual mode of transmission: Secondary | ICD-10-CM | POA: Diagnosis not present

## 2019-01-08 DIAGNOSIS — Z331 Pregnant state, incidental: Secondary | ICD-10-CM

## 2019-01-08 DIAGNOSIS — Z3A18 18 weeks gestation of pregnancy: Secondary | ICD-10-CM

## 2019-01-08 DIAGNOSIS — Z3490 Encounter for supervision of normal pregnancy, unspecified, unspecified trimester: Secondary | ICD-10-CM | POA: Insufficient documentation

## 2019-01-08 DIAGNOSIS — O0932 Supervision of pregnancy with insufficient antenatal care, second trimester: Secondary | ICD-10-CM

## 2019-01-08 DIAGNOSIS — Z3482 Encounter for supervision of other normal pregnancy, second trimester: Secondary | ICD-10-CM

## 2019-01-08 DIAGNOSIS — Z8632 Personal history of gestational diabetes: Secondary | ICD-10-CM | POA: Diagnosis not present

## 2019-01-08 DIAGNOSIS — Z36 Encounter for antenatal screening for chromosomal anomalies: Secondary | ICD-10-CM

## 2019-01-08 DIAGNOSIS — O09299 Supervision of pregnancy with other poor reproductive or obstetric history, unspecified trimester: Secondary | ICD-10-CM | POA: Insufficient documentation

## 2019-01-08 DIAGNOSIS — Z23 Encounter for immunization: Secondary | ICD-10-CM | POA: Diagnosis not present

## 2019-01-08 DIAGNOSIS — Z1379 Encounter for other screening for genetic and chromosomal anomalies: Secondary | ICD-10-CM

## 2019-01-08 DIAGNOSIS — Z1371 Encounter for nonprocreative screening for genetic disease carrier status: Secondary | ICD-10-CM

## 2019-01-08 DIAGNOSIS — Z130 Encounter for screening for diseases of the blood and blood-forming organs and certain disorders involving the immune mechanism: Secondary | ICD-10-CM

## 2019-01-08 MED ORDER — PNV PRENATAL PLUS MULTIVITAMIN 27-1 MG PO TABS: 1.0000 | ORAL_TABLET | Freq: Every day | ORAL | 11 refills | Status: DC

## 2019-01-08 MED ORDER — ONDANSETRON 4 MG PO TBDP: 4.0000 mg | ORAL_TABLET | Freq: Four times a day (QID) | ORAL | 2 refills | Status: DC | PRN

## 2019-01-08 MED ORDER — BLOOD PRESSURE MONITOR MISC: 0 refills | Status: DC

## 2019-01-08 MED ORDER — DOXYLAMINE-PYRIDOXINE 10-10 MG PO TBEC: DELAYED_RELEASE_TABLET | ORAL | 6 refills | Status: DC

## 2019-01-08 NOTE — Progress Notes (Signed)
INITIAL OBSTETRICAL VISIT Patient name: Diane Medina MRN [id number]  Date of birth: October 10, 1990 Chief Complaint:   Initial Prenatal Visit (anatomy)  History of Present Illness:   Diane Medina is a 28 y.o. G4P3  female at Unknown by [place] with an Estimated Date of Delivery: None noted. being seen today for her initial obstetrical visit.   Her obstetrical history is significant for GDM X2, obesity. She has lost 100# by eating more healthy and excercising.   Today she reports nausea.  Patient's last menstrual period was 09/02/2018 (approximate). Last pap 2019. Results were: normal Review of Systems:   Pertinent items are noted in HPI Denies cramping/contractions, leakage of fluid, vaginal bleeding, abnormal vaginal discharge w/ itching/odor/irritation, headaches, visual changes, shortness of breath, chest pain, abdominal pain, severe nausea/vomiting, or problems with urination or bowel movements unless otherwise stated above.  Pertinent History Reviewed:  Reviewed past medical,surgical, social, obstetrical and family history.  Reviewed problem list, medications and allergies. OB History  Gravida Para Term Preterm AB Living  4 3 3     3   SAB TAB Ectopic Multiple Live Births          3    # Outcome Date GA Lbr Len/2nd Weight Sex Delivery Anes PTL Lv  4 Current           3 Term 12/29/13 [redacted]w[redacted]d  7 lb 12 oz (3.515 kg) F Vag-Spont None  LIV     Complications: Gestational diabetes  2 Term 10/22/12 [redacted]w[redacted]d  6 lb 6 oz (2.892 kg) F Vag-Spont Other N LIV     Complications: Gestational diabetes  1 Term 12/14/09 [redacted]w[redacted]d  7 lb 13 oz (3.544 kg) F Vag-Spont None N LIV   Physical Assessment:  There were no vitals filed for this visit.There is no height or weight on file to calculate BMI.       Physical Examination:  General appearance - well appearing, and in no distress  Mental status - alert, oriented to person, place, and time  Psych:  She has a normal mood and affect  Skin - warm and dry, normal  color, no suspicious lesions noted  Chest - effort normal, all lung fields clear to auscultation bilaterally  Heart - normal rate and regular rhythm  Abdomen - soft, nontender  Extremities:  No swelling or varicosities noted     via [redacted]w[redacted]d  No results found for this or any previous visit (from the past 24 hour(s)).  Assessment & Plan:  1) Low-Risk Pregnancy G4P3 at Unknown with an Estimated Date of Delivery: None noted.   2) Initial OB visit  3) nausea_-rx diclegis and zofran  4)  Hx GDM x2_-check A1c  Meds:  Meds ordered this encounter  Medications  . Blood Pressure Monitor MISC    Sig: For regular home bp monitoring during pregnancy    Dispense:  1 each    Refill:  0    Z34.90 Pt needs large cuff  . Doxylamine-Pyridoxine (DICLEGIS) 10-10 MG TBEC    Sig: Take 2 qhs; may also take one in am and one in afternoon prn nausea    Dispense:  120 tablet    Refill:  6    Order Specific Question:   Supervising Provider    Answer:   [place], LUTHER H [[id number]]  . ondansetron (ZOFRAN ODT) 4 MG disintegrating tablet    Sig: Take 1 tablet (4 mg total) by mouth every 6 (six) hours as needed for nausea.    Dispense:  30  tablet    Refill:  2    Order Specific Question:   Supervising Provider    Answer:   Elonda Husky, LUTHER H [[id number]]  . Prenatal Vit-Fe Fumarate-FA (PNV PRENATAL PLUS MULTIVITAMIN) 27-1 MG TABS    Sig: Take 1 tablet by mouth daily.    Dispense:  30 tablet    Refill:  11    Order Specific Question:   Supervising Provider    Answer:   Tania Ade H [[id number]]    Initial labs obtained Continue prenatal vitamins Reviewed n/v relief measures and warning s/s to report Reviewed recommended weight gain based on pre-gravid BMI Encouraged well-balanced diet Watched video for carrier screening/genetic testing:  Genetic Screening discussed First Screen and Quad Screen: requested Cystic fibrosis screening requested SMA screening requested Fragile X screening requested Ultrasound discussed;  fetal survey: requested CCNC completed  Follow-up: Return in about 4 weeks (around 02/05/2019) for [place].   Orders Placed This Encounter  Procedures  . Urine Culture  . GC/Chlamydia Probe Amp  . Flu Vaccine QUAD 36+ mos IM  . Obstetric Panel, Including HIV  . Sickle cell screen  . Urinalysis, Routine w reflex microscopic  . Pain Management Screening Profile (10S)  . Inheritest Core(CF97,SMA,FraX)  . MaterniT 21 plus Core, Blood  . AFP TETRA  . HgB A1c  . POC Urinalysis Dipstick OB    Christin Fudge DNP, CNM 01/08/2019 12:53 PM

## 2019-01-08 NOTE — Progress Notes (Signed)
[place] 67+6 wks,cephalic,anterior placenta gr 0,cx 2.9 cm,normal right ovary,left ovary not visualized,svp of fluid 4.8 cm,fhr 135 bpm,efw 246 g 61%,no obvious abnormalities,please have pt come back today for additional images of the NB

## 2019-01-08 NOTE — Patient Instructions (Addendum)
Safe Medications in Pregnancy   Acne: Benzoyl Peroxide Salicylic Acid  Backache/Headache: Tylenol: 2 regular strength every 4 hours OR              2 Extra strength every 6 hours  Colds/Coughs/Allergies: Benadryl (alcohol free) 25 mg every 6 hours as needed Breath right strips Claritin Cepacol throat lozenges Chloraseptic throat spray Cold-Eeze- up to three times per day Cough drops, alcohol free Flonase (by prescription only) Guaifenesin Mucinex Robitussin DM (plain only, alcohol free) Saline nasal spray/drops Sudafed (pseudoephedrine) & Actifed ** use only after [redacted] weeks gestation and if you do not have high blood pressure Tylenol Vicks Vaporub Zinc lozenges Zyrtec   Constipation: Colace Ducolax suppositories Fleet enema Glycerin suppositories Metamucil Milk of magnesia Miralax Senokot Smooth move tea  Diarrhea: Kaopectate Imodium A-D  *NO pepto Bismol  Hemorrhoids: Anusol Anusol HC Preparation H Tucks  Indigestion: Tums Maalox Mylanta Zantac  Pepcid  Insomnia: Benadryl (alcohol free) 25mg  every 6 hours as needed Tylenol PM Unisom, no Gelcaps  Leg Cramps: Tums MagGel  Nausea/Vomiting:  Bonine Dramamine Emetrol Ginger extract Sea bands Meclizine  Nausea medication to take during pregnancy:  Unisom (doxylamine succinate 25 mg tablets) Take one tablet daily at bedtime. If symptoms are not adequately controlled, the dose can be increased to a maximum recommended dose of two tablets daily (1/2 tablet in the morning, 1/2 tablet mid-afternoon and one at bedtime). Vitamin B6 100mg  tablets. Take one tablet twice a day (up to 200 mg per day).  Skin Rashes: Aveeno products Benadryl cream or 25mg  every 6 hours as needed Calamine Lotion 1% cortisone cream  Yeast infection: Gyne-lotrimin 7 Monistat 7   **If taking multiple medications, please check labels to avoid duplicating the same active ingredients **take medication as directed on  the label ** Do not exceed 4000 mg of tylenol in 24 hours **Do not take medications that contain aspirin or ibuprofen    Diane Medina, I greatly value your feedback.  If you receive a survey following your visit with [place] today, we appreciate you taking the time to fill it out.  Thanks, Diane Medina, CNM     Second Trimester of Pregnancy The second trimester is from week 14 through week 27 (months 4 through 6). The second trimester is often a time when you feel your best. Your body has adjusted to being pregnant, and you begin to feel better physically. Usually, morning sickness has lessened or quit completely, you may have more energy, and you may have an increase in appetite. The second trimester is also a time when the fetus is growing rapidly. At the end of the sixth month, the fetus is about 9 inches long and weighs about 1 pounds. You will likely begin to feel the baby move (quickening) between 16 and 20 weeks of pregnancy. Body changes during your second trimester Your body continues to go through many changes during your second trimester. The changes vary from woman to woman.  Your weight will continue to increase. You will notice your lower abdomen bulging out.  You may begin to get stretch marks on your hips, abdomen, and breasts.  You may develop headaches that can be relieved by medicines. The medicines should be approved by your health care provider.  You may urinate more often because the fetus is pressing on your bladder.  You may develop or continue to have heartburn as a result of your pregnancy.  You may develop constipation because certain hormones are causing the muscles that  push waste through your intestines to slow down.  You may develop hemorrhoids or swollen, bulging veins (varicose veins).  You may have back pain. This is caused by: ? Weight gain. ? Pregnancy hormones that are relaxing the joints in your pelvis. ? A shift in weight and the  muscles that support your balance.  Your breasts will continue to grow and they will continue to become tender.  Your gums may bleed and may be sensitive to brushing and flossing.  Dark spots or blotches (chloasma, mask of pregnancy) may develop on your face. This will likely fade after the baby is born.  A dark line from your belly button to the pubic area (linea nigra) may appear. This will likely fade after the baby is born.  You may have changes in your hair. These can include thickening of your hair, rapid growth, and changes in texture. Some women also have hair loss during or after pregnancy, or hair that feels dry or thin. Your hair will most likely return to normal after your baby is born.  What to expect at prenatal visits During a routine prenatal visit:  You will be weighed to make sure you and the fetus are growing normally.  Your blood pressure will be taken.  Your abdomen will be measured to track your baby's growth.  The fetal heartbeat will be listened to.  Any test results from the previous visit will be discussed.  Your health care provider may ask you:  How you are feeling.  If you are feeling the baby move.  If you have had any abnormal symptoms, such as leaking fluid, bleeding, severe headaches, or abdominal cramping.  If you are using any tobacco products, including cigarettes, chewing tobacco, and electronic cigarettes.  If you have any questions.  Other tests that may be performed during your second trimester include:  Blood tests that check for: ? Low iron levels (anemia). ? High blood sugar that affects pregnant women (gestational diabetes) between 62 and 28 weeks. ? Rh antibodies. This is to check for a protein on red blood cells (Rh factor).  Urine tests to check for infections, diabetes, or protein in the urine.  An ultrasound to confirm the proper growth and development of the baby.  An amniocentesis to check for possible genetic  problems.  Fetal screens for spina bifida and Down syndrome.  HIV (human immunodeficiency virus) testing. Routine prenatal testing includes screening for HIV, unless you choose not to have this test.  Follow these instructions at home: Medicines  Follow your health care provider's instructions regarding medicine use. Specific medicines may be either safe or unsafe to take during pregnancy.  Take a prenatal vitamin that contains at least 600 micrograms (mcg) of folic acid.  If you develop constipation, try taking a stool softener if your health care provider approves. Eating and drinking  Eat a balanced diet that includes fresh fruits and vegetables, whole grains, good sources of protein such as meat, eggs, or tofu, and low-fat dairy. Your health care provider will help you determine the amount of weight gain that is right for you.  Avoid raw meat and uncooked cheese. These carry germs that can cause birth defects in the baby.  If you have low calcium intake from food, talk to your health care provider about whether you should take a daily calcium supplement.  Limit foods that are high in fat and processed sugars, such as fried and sweet foods.  To prevent constipation: ? Drink enough fluid  to keep your urine clear or pale yellow. ? Eat foods that are high in fiber, such as fresh fruits and vegetables, whole grains, and beans. Activity  Exercise only as directed by your health care provider. Most women can continue their usual exercise routine during pregnancy. Try to exercise for 30 minutes at least 5 days a week. Stop exercising if you experience uterine contractions.  Avoid heavy lifting, wear low heel shoes, and practice good posture.  A sexual relationship may be continued unless your health care provider directs you otherwise. Relieving pain and discomfort  Wear a good support bra to prevent discomfort from breast tenderness.  Take warm sitz baths to soothe any pain or  discomfort caused by hemorrhoids. Use hemorrhoid cream if your health care provider approves.  Rest with your legs elevated if you have leg cramps or low back pain.  If you develop varicose veins, wear support hose. Elevate your feet for 15 minutes, 3-4 times a day. Limit salt in your diet. Prenatal Care  Write down your questions. Take them to your prenatal visits.  Keep all your prenatal visits as told by your health care provider. This is important. Safety  Wear your seat belt at all times when driving.  Make a list of emergency phone numbers, including numbers for family, friends, the hospital, and police and fire departments. General instructions  Ask your health care provider for a referral to a local prenatal education class. Begin classes no later than the beginning of month 6 of your pregnancy.  Ask for help if you have counseling or nutritional needs during pregnancy. Your health care provider can offer advice or refer you to specialists for help with various needs.  Do not use hot tubs, steam rooms, or saunas.  Do not douche or use tampons or scented sanitary pads.  Do not cross your legs for long periods of time.  Avoid cat litter boxes and soil used by cats. These carry germs that can cause birth defects in the baby and possibly loss of the fetus by miscarriage or stillbirth.  Avoid all smoking, herbs, alcohol, and unprescribed drugs. Chemicals in these products can affect the formation and growth of the baby.  Do not use any products that contain nicotine or tobacco, such as cigarettes and e-cigarettes. If you need help quitting, ask your health care provider.  Visit your dentist if you have not gone yet during your pregnancy. Use a soft toothbrush to brush your teeth and be gentle when you floss. Contact a health care provider if:  You have dizziness.  You have mild pelvic cramps, pelvic pressure, or nagging pain in the abdominal area.  You have persistent  nausea, vomiting, or diarrhea.  You have a bad smelling vaginal discharge.  You have pain when you urinate. Get help right away if:  You have a fever.  You are leaking fluid from your vagina.  You have spotting or bleeding from your vagina.  You have severe abdominal cramping or pain.  You have rapid weight gain or weight loss.  You have shortness of breath with chest pain.  You notice sudden or extreme swelling of your face, hands, ankles, feet, or legs.  You have not felt your baby move in over an hour.  You have severe headaches that do not go away when you take medicine.  You have vision changes. Summary  The second trimester is from week 14 through week 27 (months 4 through 6). It is also a time when  the fetus is growing rapidly.  Your body goes through many changes during pregnancy. The changes vary from woman to woman.  Avoid all smoking, herbs, alcohol, and unprescribed drugs. These chemicals affect the formation and growth your baby.  Do not use any tobacco products, such as cigarettes, chewing tobacco, and e-cigarettes. If you need help quitting, ask your health care provider.  Contact your health care provider if you have any questions. Keep all prenatal visits as told by your health care provider. This is important. This information is not intended to replace advice given to you by your health care provider. Make sure you discuss any questions you have with your health care provider.      Go to [email or web address] to register for FREE online childbirth classes   [facility] hospital, [facility], has to be paid up front before it is done. If you [facility] you can make payments during pregnancy. If you are interested in this, see receptionist at check-out.

## 2019-01-09 LAB — PMP SCREEN PROFILE (10S), URINE
Amphetamine Scrn, Ur: NEGATIVE ng/mL
BARBITURATE SCREEN URINE: NEGATIVE ng/mL
BENZODIAZEPINE SCREEN, URINE: NEGATIVE ng/mL
CANNABINOIDS UR QL SCN: NEGATIVE ng/mL
Cocaine (Metab) Scrn, Ur: NEGATIVE ng/mL
Creatinine(Crt), U: 131.2 mg/dL (ref 20.0–300.0)
Methadone Screen, Urine: NEGATIVE ng/mL
OXYCODONE+OXYMORPHONE UR QL SCN: NEGATIVE ng/mL
Opiate Scrn, Ur: NEGATIVE ng/mL
Ph of Urine: 6.3 (ref 4.5–8.9)
Phencyclidine Qn, Ur: NEGATIVE ng/mL
Propoxyphene Scrn, Ur: NEGATIVE ng/mL

## 2019-01-09 LAB — URINALYSIS, ROUTINE W REFLEX MICROSCOPIC
Bilirubin, UA: NEGATIVE
Glucose, UA: NEGATIVE
Ketones, UA: NEGATIVE
Nitrite, UA: NEGATIVE
Protein,UA: NEGATIVE
RBC, UA: NEGATIVE
Specific Gravity, UA: 1.019 (ref 1.005–1.030)
Urobilinogen, Ur: 0.2 mg/dL (ref 0.2–1.0)
pH, UA: 6.5 (ref 5.0–7.5)

## 2019-01-09 LAB — MICROSCOPIC EXAMINATION
Casts: NONE SEEN /lpf
Epithelial Cells (non renal): >10 /hpf — AB (ref 0–10)

## 2019-01-10 LAB — GC/CHLAMYDIA PROBE AMP
Chlamydia trachomatis, NAA: NEGATIVE
Neisseria Gonorrhoeae by PCR: NEGATIVE

## 2019-01-10 LAB — URINE CULTURE: Organism ID, Bacteria: NO GROWTH

## 2019-01-19 DIAGNOSIS — Z3482 Encounter for supervision of other normal pregnancy, second trimester: Secondary | ICD-10-CM | POA: Diagnosis not present

## 2019-01-19 DIAGNOSIS — Z36 Encounter for antenatal screening for chromosomal anomalies: Secondary | ICD-10-CM | POA: Diagnosis not present

## 2019-01-19 DIAGNOSIS — Z3A18 18 weeks gestation of pregnancy: Secondary | ICD-10-CM | POA: Diagnosis not present

## 2019-01-19 DIAGNOSIS — Z1379 Encounter for other screening for genetic and chromosomal anomalies: Secondary | ICD-10-CM | POA: Diagnosis not present

## 2019-01-19 DIAGNOSIS — Z130 Encounter for screening for diseases of the blood and blood-forming organs and certain disorders involving the immune mechanism: Secondary | ICD-10-CM | POA: Diagnosis not present

## 2019-01-20 LAB — OBSTETRIC PANEL, INCLUDING HIV
Antibody Screen: NEGATIVE
Basophils Absolute: 0 10*3/uL (ref 0.0–0.2)
Basos: 0 %
EOS (ABSOLUTE): 0 10*3/uL (ref 0.0–0.4)
Eos: 0 %
HIV Screen 4th Generation wRfx: NONREACTIVE
Hematocrit: 37.4 % (ref 34.0–46.6)
Hemoglobin: 12.3 g/dL (ref 11.1–15.9)
Hepatitis B Surface Ag: NEGATIVE
Immature Grans (Abs): 0 10*3/uL (ref 0.0–0.1)
Immature Granulocytes: 0 %
Lymphocytes Absolute: 1.4 10*3/uL (ref 0.7–3.1)
Lymphs: 20 %
MCH: 26.9 pg (ref 26.6–33.0)
MCHC: 32.9 g/dL (ref 31.5–35.7)
MCV: 82 fL (ref 79–97)
Monocytes Absolute: 0.4 10*3/uL (ref 0.1–0.9)
Monocytes: 6 %
Neutrophils Absolute: 5.2 10*3/uL (ref 1.4–7.0)
Neutrophils: 74 %
Platelets: 160 10*3/uL (ref 150–450)
RBC: 4.57 x10E6/uL (ref 3.77–5.28)
RDW: 14.5 % (ref 11.7–15.4)
RPR Ser Ql: NONREACTIVE
Rh Factor: POSITIVE
Rubella Antibodies, IGG: 4.21 index (ref >0.99)
WBC: 7.1 10*3/uL (ref 3.4–10.8)

## 2019-01-20 LAB — HEMOGLOBIN A1C
Est. average glucose Bld gHb Est-mCnc: 91 mg/dL
Hgb A1c MFr Bld: 4.8 % (ref 4.8–5.6)

## 2019-01-20 LAB — SICKLE CELL SCREEN: Sickle Cell Screen: NEGATIVE

## 2019-01-23 LAB — AFP TETRA
DIA Mom Value: 0.83
DIA Value (EIA): 114.11 pg/mL
DSR (By Age)    1 IN: 805
DSR (Second Trimester) 1 IN: 10000
Gestational Age: 19.7 WEEKS
MSAFP Mom: 0.86
MSAFP: 34.4 ng/mL
MSHCG Mom: 0.34
MSHCG: 6068 m[IU]/mL
Maternal Age At EDD: 28.7 yr
Osb Risk: 10000
T18 (By Age): 1:3134 {titer}
Test Results:: NEGATIVE
Weight: 253 [lb_av]
uE3 Mom: 1.25
uE3 Value: 2.36 ng/mL

## 2019-01-23 LAB — MATERNIT 21 PLUS CORE, BLOOD
Fetal Fraction: 9
Result (T21): NEGATIVE
Trisomy 13 (Patau syndrome): NEGATIVE
Trisomy 18 (Edwards syndrome): NEGATIVE
Trisomy 21 (Down syndrome): NEGATIVE

## 2019-01-28 LAB — INHERITEST CORE(CF97,SMA,FRAX)

## 2019-02-05 ENCOUNTER — Encounter: Payer: Medicaid Other | Admitting: Family Medicine

## 2019-02-27 ENCOUNTER — Other Ambulatory Visit: Payer: Self-pay | Admitting: Advanced Practice Midwife

## 2019-03-03 ENCOUNTER — Encounter: Payer: Medicaid Other | Admitting: Obstetrics & Gynecology

## 2019-03-05 ENCOUNTER — Other Ambulatory Visit: Payer: Medicaid Other

## 2019-03-05 ENCOUNTER — Encounter: Payer: Medicaid Other | Admitting: Advanced Practice Midwife

## 2019-04-03 NOTE — L&D Delivery Note (Signed)
Delivery Note At 4:56 AM a viable female was delivered via Vaginal, Spontaneous (Presentation: Left Occiput Anterior).  APGAR: 9, 9; weight  Pending. After 1 minute, the cord was clamped and cut. 40 units of pitocin diluted in 1000cc LR was infused rapidly IV.  The placenta separated spontaneously and delivered via CCT and maternal pushing effort.  It was inspected and appears to be intact with a 3 VC.  Anesthesia none:   Episiotomy: None Lacerations:  none Suture Repair:  Est. Blood Loss (mL):  200  Mom to postpartum.  Baby to Couplet care / Skin to Skin.  Diane Medina 06/09/2019, 5:09 AM

## 2019-06-08 ENCOUNTER — Telehealth (HOSPITAL_COMMUNITY): Payer: Self-pay | Admitting: *Deleted

## 2019-06-08 ENCOUNTER — Other Ambulatory Visit: Payer: Self-pay

## 2019-06-08 ENCOUNTER — Encounter (HOSPITAL_COMMUNITY): Payer: Self-pay | Admitting: *Deleted

## 2019-06-08 ENCOUNTER — Ambulatory Visit (INDEPENDENT_AMBULATORY_CARE_PROVIDER_SITE_OTHER): Payer: Medicaid Other | Admitting: Advanced Practice Midwife

## 2019-06-08 VITALS — BP 126/75 | HR 85 | Wt 282.0 lb

## 2019-06-08 DIAGNOSIS — Z3483 Encounter for supervision of other normal pregnancy, third trimester: Secondary | ICD-10-CM | POA: Diagnosis not present

## 2019-06-08 DIAGNOSIS — Z1389 Encounter for screening for other disorder: Secondary | ICD-10-CM

## 2019-06-08 DIAGNOSIS — Z3A39 39 weeks gestation of pregnancy: Secondary | ICD-10-CM | POA: Diagnosis not present

## 2019-06-08 DIAGNOSIS — Z331 Pregnant state, incidental: Secondary | ICD-10-CM

## 2019-06-08 LAB — POCT URINALYSIS DIPSTICK OB
Glucose, UA: NEGATIVE
Ketones, UA: NEGATIVE
Leukocytes, UA: NEGATIVE
Nitrite, UA: NEGATIVE
POC,PROTEIN,UA: NEGATIVE

## 2019-06-08 NOTE — Telephone Encounter (Signed)
Preadmission screen  

## 2019-06-08 NOTE — Progress Notes (Signed)
   Induction Assessment Scheduling Form: Fax to [facility]:  [phone]  Diane Medina                                                                                   DOB:  02/08/1991                                                            MRN:  [id number]                                                                     Phone #:   [phone]                         Provider:  [facility]  GP:  [id number]                                                            Estimated Date of Delivery: 06/09/19  Dating Criteria: [place]    Medical Indications for induction:  postdates Admission Date/Time:  3/16 am Gestational age on admission:  29   Filed Weights   06/08/19 1012  Weight: 282 lb (127.9 kg)   HIV:  Non Reactive (10/19 1154) GBS:    1.5/thick   Method of induction(proposed):  outpt foley>pit   Scheduling Provider Signature:  Jacklyn Shell, CNM                                            Today's Date:  06/08/2019

## 2019-06-08 NOTE — Patient Instructions (Addendum)
OUTPATIENT FOLEY BULB INDUCTION OF LABOR:  Information Sheet for Mothers and Family               What's a Foley Bulb Induction? A Foley bulb induction is a procedure where your provider inserts a catheter into your cervix. Once inside your womb, your provider inflates the balloon with a saline solution.   This puts pressure on your cervix and encourages dilation. The catheter falls out once your cervix dilates to 3-4 centimeters.     With any procedure, it's important that you know what to expect. The insertion of a Foley catheter can be a bit uncomfortable, and some women experience sharp pelvic pain. The pain may subside once the catheter is in place. You may experience some cramping when the Foley catheter is in place.  This is normal.     GO TO THE MATERNITY ADMISSIONS UNIT FOR THE FOLLOWING:  Heavy vaginal bleeding  Rupture of membranes (fluid that wets your underwear)  Painful uterine contractions every 5 minutes or less  Severe abdominal discomfort  Decreased movement of the baby    .laborsign

## 2019-06-08 NOTE — Progress Notes (Signed)
   LOW-RISK PREGNANCY VISIT Patient name: Diane Medina MRN [id number]  Date of birth: 1990/11/17 Chief Complaint:   Routine Prenatal Visit  History of Present Illness:   Diane Medina is a 29 y.o. [phone] female at [redacted]w[redacted]d with an Estimated Date of Delivery: 06/09/19 being seen today for ongoing management of a low-risk pregnancy.  Today she reports she has been getting gPNC iin [place] since 1/13 up until 3/2. Records here and reviewed. BPs all normal, growth [place] normal, up to date with labs, etc. GBS neg 2/9. Signed BTL papers in [place], will have to resign for [place] . Contractions: Irregular. Vag. Bleeding: Bloody Show.  Movement: Present. denies leaking of fluid. Review of Systems:   Pertinent items are noted in HPI Denies abnormal vaginal discharge w/ itching/odor/irritation, headaches, visual changes, shortness of breath, chest pain, abdominal pain, severe nausea/vomiting, or problems with urination or bowel movements unless otherwise stated above. Pertinent History Reviewed:  Reviewed past medical,surgical, social, obstetrical and family history.  Reviewed problem list, medications and allergies. Physical Assessment:   Vitals:   06/08/19 1012  BP: 126/75  Pulse: 85  Weight: 282 lb (127.9 kg)  Body mass index is 46.93 kg/m.        Physical Examination:   General appearance: Well appearing, and in no distress  Mental status: Alert, oriented to person, place, and time  Skin: Warm & dry  Cardiovascular: Normal heart rate noted  Respiratory: Normal respiratory effort, no distress  Abdomen: Soft, gravid, nontender  Pelvic: Cervical exam performed  Dilation: 1.5 Effacement (%): Thick Station: -3  Extremities: Edema: Trace  Fetal Status: Fetal Heart Rate (bpm): 140 Fundal Height: 38 cm Movement: Present Presentation: Vertex  Chaperone: Stoney Bang    Results for orders placed or performed in visit on 06/08/19 (from the past 24 hour(s))  POC Urinalysis Dipstick OB   Collection Time:  06/08/19 10:07 AM  Result Value Ref Range   Color, UA     Clarity, UA     Glucose, UA Negative Negative   Bilirubin, UA     Ketones, UA n    Spec Grav, UA     Blood, UA moderate    pH, UA     POC,PROTEIN,UA Negative Negative, Trace, Small (1+), Moderate (2+), Large (3+), 4+   Urobilinogen, UA     Nitrite, UA n    Leukocytes, UA Negative Negative   Appearance     Odor      Assessment & Plan:  1) Low-risk pregnancy [id number] at [redacted]w[redacted]d with an Estimated Date of Delivery: 06/09/19   Meds: No orders of the defined types were placed in this encounter.  Labs/procedures today: none  Plan:  Continue routine obstetrical care  Next visit: prefers will be in person for NST    Reviewed: Term labor symptoms and general obstetric precautions including but not limited to vaginal bleeding, contractions, leaking of fluid and fetal movement were reviewed in detail with the patient.  All questions were answered.  Follow-up: Return in about 4 days (around 06/12/2019) for NST RN only and 3/15  late afternoon) for foley placement/NST, sign BTL and 4/3ish for pre op BTL. IOL 06/16/19 in the AM and Foley the day before Orders Placed This Encounter  Procedures  . POC Urinalysis Dipstick OB   Jacklyn Shell DNP, CNM 06/08/2019 11:00 AM

## 2019-06-09 ENCOUNTER — Encounter (HOSPITAL_COMMUNITY): Payer: Self-pay | Admitting: Obstetrics & Gynecology

## 2019-06-09 ENCOUNTER — Inpatient Hospital Stay (HOSPITAL_COMMUNITY): Admission: AD | Admit: 2019-06-09 | Payer: Medicaid Other | Source: Home / Self Care

## 2019-06-09 ENCOUNTER — Encounter (HOSPITAL_COMMUNITY)
Admission: AD | Disposition: A | Discharge status: Home / Self Care | Payer: Self-pay | Source: Home / Self Care | Attending: Family Medicine

## 2019-06-09 ENCOUNTER — Encounter (HOSPITAL_COMMUNITY): Payer: Self-pay | Admitting: Certified Registered Nurse Anesthetist

## 2019-06-09 ENCOUNTER — Other Ambulatory Visit: Payer: Self-pay

## 2019-06-09 ENCOUNTER — Inpatient Hospital Stay (HOSPITAL_COMMUNITY)
Admission: AD | Admit: 2019-06-09 | Discharge: 2019-06-10 | DRG: 807 | Disposition: A | Discharge status: Home / Self Care | Payer: Medicaid Other | Attending: Family Medicine | Admitting: Family Medicine

## 2019-06-09 DIAGNOSIS — Z20822 Contact with and (suspected) exposure to covid-19: Secondary | ICD-10-CM | POA: Diagnosis present

## 2019-06-09 DIAGNOSIS — Z3A40 40 weeks gestation of pregnancy: Secondary | ICD-10-CM | POA: Diagnosis not present

## 2019-06-09 DIAGNOSIS — O0932 Supervision of pregnancy with insufficient antenatal care, second trimester: Secondary | ICD-10-CM

## 2019-06-09 DIAGNOSIS — Z880 Allergy status to penicillin: Secondary | ICD-10-CM | POA: Diagnosis not present

## 2019-06-09 DIAGNOSIS — O26893 Other specified pregnancy related conditions, third trimester: Secondary | ICD-10-CM | POA: Diagnosis present

## 2019-06-09 DIAGNOSIS — Z3483 Encounter for supervision of other normal pregnancy, third trimester: Secondary | ICD-10-CM

## 2019-06-09 LAB — CBC
HCT: 33.7 % — ABNORMAL LOW (ref 36.0–46.0)
Hemoglobin: 10.5 g/dL — ABNORMAL LOW (ref 12.0–15.0)
MCH: 24.4 pg — ABNORMAL LOW (ref 26.0–34.0)
MCHC: 31.2 g/dL (ref 30.0–36.0)
MCV: 78.4 fL — ABNORMAL LOW (ref 80.0–100.0)
Platelets: 157 10*3/uL (ref 150–400)
RBC: 4.3 MIL/uL (ref 3.87–5.11)
RDW: 13.7 % (ref 11.5–15.5)
WBC: 10 10*3/uL (ref 4.0–10.5)
nRBC: 0 % (ref 0.0–0.2)

## 2019-06-09 LAB — TYPE AND SCREEN
ABO/RH(D): B POS
Antibody Screen: NEGATIVE

## 2019-06-09 LAB — RPR: RPR Ser Ql: NONREACTIVE

## 2019-06-09 LAB — RESPIRATORY PANEL BY RT PCR (FLU A&B, COVID)
Influenza A by PCR: NEGATIVE
Influenza B by PCR: NEGATIVE
SARS Coronavirus 2 by RT PCR: NEGATIVE

## 2019-06-09 LAB — ABO/RH: ABO/RH(D): B POS

## 2019-06-09 SURGERY — LIGATION, FALLOPIAN TUBE, POSTPARTUM
Anesthesia: Choice

## 2019-06-09 MED ORDER — ONDANSETRON HCL 4 MG/2ML IJ SOLN: 4.0000 mg | Freq: Four times a day (QID) | INTRAMUSCULAR | Status: DC | PRN

## 2019-06-09 MED ORDER — DOCUSATE SODIUM 100 MG PO CAPS
100.0000 mg | ORAL_CAPSULE | Freq: Two times a day (BID) | ORAL | Status: DC
  Administered 2019-06-09 – 2019-06-10 (×2): 100 mg via ORAL
  Filled 2019-06-09 (×2): qty 1

## 2019-06-09 MED ORDER — HYDROXYZINE HCL 50 MG PO TABS: 50.0000 mg | ORAL_TABLET | Freq: Four times a day (QID) | ORAL | Status: DC | PRN

## 2019-06-09 MED ORDER — ACETAMINOPHEN 325 MG PO TABS
650.0000 mg | ORAL_TABLET | ORAL | Status: DC | PRN
  Administered 2019-06-09: 650 mg via ORAL
  Filled 2019-06-09: qty 2

## 2019-06-09 MED ORDER — FLEET ENEMA 7-19 GM/118ML RE ENEM: 1.0000 | ENEMA | Freq: Every day | RECTAL | Status: DC | PRN

## 2019-06-09 MED ORDER — ZOLPIDEM TARTRATE 5 MG PO TABS: 5.0000 mg | ORAL_TABLET | Freq: Every evening | ORAL | Status: DC | PRN

## 2019-06-09 MED ORDER — TERBUTALINE SULFATE 1 MG/ML IJ SOLN: 0.2500 mg | Freq: Once | INTRAMUSCULAR | Status: DC | PRN

## 2019-06-09 MED ORDER — FENTANYL CITRATE (PF) 100 MCG/2ML IJ SOLN
50.0000 ug | INTRAMUSCULAR | Status: DC | PRN
  Administered 2019-06-09: 50 ug via INTRAVENOUS
  Filled 2019-06-09: qty 2

## 2019-06-09 MED ORDER — METHYLERGONOVINE MALEATE 0.2 MG/ML IJ SOLN: 0.2000 mg | INTRAMUSCULAR | Status: DC | PRN

## 2019-06-09 MED ORDER — DIPHENHYDRAMINE HCL 25 MG PO CAPS: 25.0000 mg | ORAL_CAPSULE | Freq: Four times a day (QID) | ORAL | Status: DC | PRN

## 2019-06-09 MED ORDER — FLEET ENEMA 7-19 GM/118ML RE ENEM: 1.0000 | ENEMA | RECTAL | Status: DC | PRN

## 2019-06-09 MED ORDER — LIDOCAINE HCL (PF) 1 % IJ SOLN: 30.0000 mL | INTRAMUSCULAR | Status: DC | PRN

## 2019-06-09 MED ORDER — BENZOCAINE-MENTHOL 20-0.5 % EX AERO: 1.0000 "application " | INHALATION_SPRAY | CUTANEOUS | Status: DC | PRN

## 2019-06-09 MED ORDER — SIMETHICONE 80 MG PO CHEW: 80.0000 mg | CHEWABLE_TABLET | ORAL | Status: DC | PRN

## 2019-06-09 MED ORDER — PRENATAL MULTIVITAMIN CH
1.0000 | ORAL_TABLET | Freq: Every day | ORAL | Status: DC
  Administered 2019-06-09 – 2019-06-10 (×2): 1 via ORAL
  Filled 2019-06-09 (×2): qty 1

## 2019-06-09 MED ORDER — WITCH HAZEL-GLYCERIN EX PADS: 1.0000 "application " | MEDICATED_PAD | CUTANEOUS | Status: DC | PRN

## 2019-06-09 MED ORDER — ONDANSETRON HCL 4 MG PO TABS: 4.0000 mg | ORAL_TABLET | ORAL | Status: DC | PRN

## 2019-06-09 MED ORDER — LACTATED RINGERS IV SOLN: INTRAVENOUS | Status: DC

## 2019-06-09 MED ORDER — MEASLES, MUMPS & RUBELLA VAC IJ SOLR: 0.5000 mL | Freq: Once | INTRAMUSCULAR | Status: DC

## 2019-06-09 MED ORDER — FERROUS SULFATE 325 (65 FE) MG PO TABS
325.0000 mg | ORAL_TABLET | Freq: Two times a day (BID) | ORAL | Status: DC
  Administered 2019-06-09 – 2019-06-10 (×3): 325 mg via ORAL
  Filled 2019-06-09 (×3): qty 1

## 2019-06-09 MED ORDER — OXYTOCIN BOLUS FROM INFUSION
500.0000 mL | Freq: Once | INTRAVENOUS | Status: AC
  Administered 2019-06-09: 500 mL via INTRAVENOUS

## 2019-06-09 MED ORDER — DIBUCAINE (PERIANAL) 1 % EX OINT: 1.0000 "application " | TOPICAL_OINTMENT | CUTANEOUS | Status: DC | PRN

## 2019-06-09 MED ORDER — IBUPROFEN 600 MG PO TABS
600.0000 mg | ORAL_TABLET | Freq: Four times a day (QID) | ORAL | Status: DC
  Administered 2019-06-09 – 2019-06-10 (×6): 600 mg via ORAL
  Filled 2019-06-09 (×6): qty 1

## 2019-06-09 MED ORDER — OXYCODONE-ACETAMINOPHEN 5-325 MG PO TABS: 1.0000 | ORAL_TABLET | ORAL | Status: DC | PRN

## 2019-06-09 MED ORDER — MISOPROSTOL 50MCG HALF TABLET: 50.0000 ug | ORAL_TABLET | ORAL | Status: DC

## 2019-06-09 MED ORDER — ACETAMINOPHEN 325 MG PO TABS: 650.0000 mg | ORAL_TABLET | ORAL | Status: DC | PRN

## 2019-06-09 MED ORDER — BISACODYL 10 MG RE SUPP: 10.0000 mg | Freq: Every day | RECTAL | Status: DC | PRN

## 2019-06-09 MED ORDER — OXYTOCIN 40 UNITS IN NORMAL SALINE INFUSION - SIMPLE MED
2.5000 [IU]/h | INTRAVENOUS | Status: DC
  Filled 2019-06-09: qty 1000

## 2019-06-09 MED ORDER — LACTATED RINGERS IV SOLN: 500.0000 mL | INTRAVENOUS | Status: DC | PRN

## 2019-06-09 MED ORDER — COCONUT OIL OIL: 1.0000 "application " | TOPICAL_OIL | Status: DC | PRN

## 2019-06-09 MED ORDER — METHYLERGONOVINE MALEATE 0.2 MG PO TABS: 0.2000 mg | ORAL_TABLET | ORAL | Status: DC | PRN

## 2019-06-09 MED ORDER — SOD CITRATE-CITRIC ACID 500-334 MG/5ML PO SOLN: 30.0000 mL | ORAL | Status: DC | PRN

## 2019-06-09 MED ORDER — TETANUS-DIPHTH-ACELL PERTUSSIS 5-2.5-18.5 LF-MCG/0.5 IM SUSP: 0.5000 mL | Freq: Once | INTRAMUSCULAR | Status: DC

## 2019-06-09 MED ORDER — OXYCODONE-ACETAMINOPHEN 5-325 MG PO TABS: 2.0000 | ORAL_TABLET | ORAL | Status: DC | PRN

## 2019-06-09 MED ORDER — ONDANSETRON HCL 4 MG/2ML IJ SOLN: 4.0000 mg | INTRAMUSCULAR | Status: DC | PRN

## 2019-06-09 NOTE — H&P (Signed)
Taniaya Rudder is a 29 y.o. female [phone] with IUP at [redacted]w[redacted]d presenting for contractions associated with none vaginal bleeding for several hours..  Membranes are intact, with active fetal movement.   [facility] at [facility] and in [place] Prenatal History/Complications:  Term SVD X3 without problems  Past Medical History: Past Medical History:  Diagnosis Date   Gestational diabetes     Past Surgical History: Past Surgical History:  Procedure Laterality Date   NO PAST SURGERIES      Obstetrical History: OB History     Gravida  4   Para  3   Term  3   Preterm      AB      Living  3      SAB      TAB      Ectopic      Multiple      Live Births  3            Social History: Social History   Socioeconomic History   Marital status: Single    Spouse name: Not on file   Number of children: Not on file   Years of education: Not on file   Highest education level: Associate degree: occupational, Scientist, product/process development, or vocational program  Occupational History   Not on file  Tobacco Use   Smoking status: Never Smoker   Smokeless tobacco: Never Used  Substance and Sexual Activity   Alcohol use: No   Drug use: No   Sexual activity: Yes    Birth control/protection: None  Other Topics Concern   Not on file  Social History Narrative   Not on file   Social Determinants of Health   Financial Resource Strain: Low Risk    Difficulty of Paying Living Expenses: Not hard at all  Food Insecurity: No Food Insecurity   Worried About Programme researcher, broadcasting/film/video in the Last Year: Never true   Ran Out of Food in the Last Year: Never true  Transportation Needs: No Transportation Needs   Lack of Transportation (Medical): No   Lack of Transportation (Non-Medical): No  Physical Activity: Unknown   Days of Exercise per Week: 1 day   Minutes of Exercise per Session: Not asked  Stress: No Stress Concern Present   Feeling of Stress : Not at all  Social Connections: Somewhat Isolated   Frequency of Communication with Friends and Family: More than three times a week   Frequency of Social Gatherings with Friends and Family: More than three times a week   Attends Religious Services: Never   Database administrator or Organizations: No   Attends Engineer, structural: Never   Marital Status: Living with partner    Family History: Family History  Problem Relation Age of Onset   Dilated cardiomyopathy Father    ADD / ADHD Brother    Autism Brother    Multiple sclerosis Paternal Aunt    Cancer Maternal Grandfather    Diabetes Paternal Grandmother    Autism Daughter     Allergies: Allergies  Allergen Reactions   Penicillins     Anaphylaxis     Medications Prior to Admission  Medication Sig Dispense Refill Last Dose   Ascorbic Acid (VITAMIN C) 500 MG CAPS Take by mouth.   06/08/2019 at Unknown time   Doxylamine-Pyridoxine (DICLEGIS) 10-10 MG TBEC Take 2 qhs; may also take one in am and one in afternoon prn nausea 120 tablet 6 06/08/2019 at Unknown time   ferrous  sulfate 325 (65 FE) MG tablet Take 325 mg by mouth daily with breakfast.   06/08/2019 at Unknown time   ondansetron (ZOFRAN-ODT) 4 MG disintegrating tablet DISSOLVE 1 TABLET IN MOUTH EVERY 6 HOURS AS NEEDED FOR NAUSEA 60 tablet 0 06/09/2019 at 1800   Prenatal Vit-Fe Fumarate-FA (PREPLUS) 27-1 MG TABS Take by mouth.   06/08/2019 at Unknown time        Review of Systems   Constitutional: Negative for fever and chills Eyes: Negative for visual disturbances Respiratory: Negative for shortness of breath, dyspnea Cardiovascular: Negative for chest pain or palpitations  Gastrointestinal: Negative for vomiting, diarrhea and constipation.  POSITIVE for abdominal pain (contractions) Genitourinary: Negative for dysuria and urgency Musculoskeletal: Negative for back pain, joint pain, myalgias  Neurological: Negative for dizziness and headaches      Blood pressure 132/73, pulse 77, temperature 98.2 F (36.8  C), temperature source Oral, resp. rate 17, height 5\' 6"  (1.676 m), weight 128.6 kg, last menstrual period 09/02/2018. General appearance: alert, cooperative, and no distress Lungs: normal respiratory effort Heart: regular rate and rhythm Abdomen: soft, non-tender; bowel sounds normal Extremities: Homans sign is negative, no sign of DVT DTR's 2+ Presentation: cephalic Fetal monitoring  Baseline: 145 bpm, Variability: Good {> 6 bpm), Accelerations: Reactive, and Decelerations: Absent Uterine activity  2 minutes Cx 6/90/-2   Prenatal labs: ABO, Rh: --/--/B POS, B POS Performed at [facility], [place] [address] [place], [place] [address]  [phone]/09 0330) Antibody: NEG (03/09 0330) Rubella: 4.21 (10/19 1154) RPR: Non Reactive (10/19 1154)  HBsAg: Negative (10/19 1154)  HIV: Non Reactive (10/19 1154)  GBS:   neg 1 hr Glucola 115 Genetic screening  neg Anatomy US normal  Prenatal Transfer Tool  Maternal Diabetes: No Genetic Screening: Normal Maternal Ultrasounds/Referrals: Normal Fetal Ultrasounds or other Referrals:  None Maternal Substance Abuse:  No Significant Maternal Medications:  None Significant Maternal Lab Results: Group B Strep negative     Results for orders placed or performed during the hospital encounter of 06/09/19 (from the past 24 hour(s))  CBC   Collection Time: 06/09/19  3:26 AM  Result Value Ref Range   WBC 10.0 4.0 - 10.5 K/uL   RBC 4.30 3.87 - 5.11 MIL/uL   Hemoglobin 10.5 (L) 12.0 - 15.0 g/dL   HCT 33.7 (L) 36.0 - 46.0 %   MCV 78.4 (L) 80.0 - 100.0 fL   MCH 24.4 (L) 26.0 - 34.0 pg   MCHC 31.2 30.0 - 36.0 g/dL   RDW 13.7 11.5 - 15.5 %   Platelets 157 150 - 400 K/uL   nRBC 0.0 0.0 - 0.2 %  Type and screen   Collection Time: 06/09/19  3:30 AM  Result Value Ref Range   ABO/RH(D) B POS    Antibody Screen NEG    Sample Expiration      06/12/2019,2359 Performed at [facility], [place] [address], [place], [place] [address]   ABO/Rh    Collection Time: 06/09/19  3:30 AM  Result Value Ref Range   ABO/RH(D)      B POS Performed at [place] [address] [place], [place] [address]   Respiratory Panel by RT PCR (Flu A&B, Covid) - Nasopharyngeal Swab   Collection Time: 06/09/19  3:32 AM   Specimen: Nasopharyngeal Swab  Result Value Ref Range   SARS Coronavirus 2 by RT PCR NEGATIVE NEGATIVE   Influenza A by PCR NEGATIVE NEGATIVE   Influenza B by PCR  NEGATIVE NEGATIVE  Results for orders placed or performed in visit on 06/08/19 (from the past 24 hour(s))  POC Urinalysis Dipstick OB   Collection Time: 06/08/19 10:07 AM  Result Value Ref Range   Color, UA     Clarity, UA     Glucose, UA Negative Negative   Bilirubin, UA     Ketones, UA n    Spec Grav, UA     Blood, UA moderate    pH, UA     POC,PROTEIN,UA Negative Negative, Trace, Small (1+), Moderate (2+), Large (3+), 4+   Urobilinogen, UA     Nitrite, UA n    Leukocytes, UA Negative Negative   Appearance     Odor      Assessment: Madia Carvell is a 29 y.o. [phone] with an IUP at [redacted]w[redacted]d presenting for active labor  Plan: #Labor: expectant management #Pain:  Per request #FWB Cat 1 #ID: GBS: neg  #MOF:  breast #MOC: interval BTL ([place] papers signed yesterday) #Circ: no   Jacklyn Shell 06/09/2019, 5:10 AM

## 2019-06-09 NOTE — Discharge Summary (Signed)
Postpartum Discharge Summary  Date of Service updated 06/10/19     Patient Name: Diane Medina DOB: April 19, 1990 MRN: [id number]  Date of admission: 06/09/2019 Delivering Provider: Christin Fudge   Date of discharge: 06/10/2019  Admitting diagnosis: Indication for care in labor or delivery [O75.9] Intrauterine pregnancy: [redacted]w[redacted]d    Secondary diagnosis:  Active Problems:   Indication for care in labor or delivery  Additional problems: none     Discharge diagnosis: Term Pregnancy Delivered                                                                                                Post partum procedures:none  Augmentation: none  Complications: None  Hospital course:  Onset of Labor With Vaginal Delivery     29y.o. yo [id number] admitted in Active Labor on 06/09/2019. Patient had an uncomplicated labor course as follows:  Membrane Rupture Time/Date: 4:37 AM ,06/09/2019   Intrapartum Procedures: Episiotomy: None [1]                                         Lacerations:  None [1]  Patient had a delivery of a Viable infant. 06/09/2019  Information for the patient's newborn:  SlJeliyah, [place][[id number]]Delivery Method: Vaginal, Spontaneous(Filed from Delivery Summary)     Pateint had an uncomplicated postpartum course.  She is ambulating, tolerating a regular diet, passing flatus, and urinating well. Patient is discharged home in stable condition on 06/10/19.  Delivery time: 4:56 AM    Magnesium Sulfate received: No BMZ received: No Rhophylac:No MMR:No Transfusion:No  Physical exam  Vitals:   06/09/19 1229 06/09/19 1500 06/09/19 2015 06/10/19 0507  BP: 120/70 119/81 122/66 127/66  Pulse: 72 63 67 61  Resp: '18 18 18 18  ' Temp: 98.1 F (36.7 C) 98 F (36.7 C) 98.2 F (36.8 C) 98 F (36.7 C)  TempSrc: Oral Oral Axillary Oral  SpO2: 100%  100% 100%  Weight:      Height:       General: alert, cooperative and no distress Lochia:  appropriate Uterine Fundus: firm Incision: N/A DVT Evaluation: No evidence of DVT seen on physical exam. Negative Homan's sign. No cords or calf tenderness. No significant calf/ankle edema. Labs: Lab Results  Component Value Date   WBC 10.0 06/09/2019   HGB 10.5 (L) 06/09/2019   HCT 33.7 (L) 06/09/2019   MCV 78.4 (L) 06/09/2019   PLT 157 06/09/2019   CMP Latest Ref Rng & Units 10/27/2018  Glucose 70 - 99 mg/dL 94  BUN 6 - 20 mg/dL 13  Creatinine 0.44 - 1.00 mg/dL 0.53  Sodium 135 - 145 mmol/L 136  Potassium 3.5 - 5.1 mmol/L 3.9  Chloride 98 - 111 mmol/L 105  CO2 22 - 32 mmol/L 24  Calcium 8.9 - 10.3 mg/dL 8.8(L)  Total Protein 6.5 - 8.1 g/dL 7.2  Total Bilirubin 0.3 - 1.2 mg/dL 0.9  Alkaline Phos 38 - 126 U/L 53  AST 15 -  41 U/L 16  ALT 0 - 44 U/L 11   Edinburgh Score: Edinburgh Postnatal Depression Scale Screening Tool 06/09/2019  I have been able to laugh and see the funny side of things. 0  I have looked forward with enjoyment to things. 0  I have blamed myself unnecessarily when things went wrong. 1  I have been anxious or worried for no good reason. 2  I have felt scared or panicky for no good reason. 0  Things have been getting on top of me. 1  I have been so unhappy that I have had difficulty sleeping. 0  I have felt sad or miserable. 1  I have been so unhappy that I have been crying. 1  The thought of harming myself has occurred to me. 0  Edinburgh Postnatal Depression Scale Total 6    Discharge instruction: per After Visit Summary and "Baby and Me Booklet".  After visit meds:  Allergies as of 06/10/2019      Reactions   Penicillins Anaphylaxis   Did it involve swelling of the face/tongue/throat, SOB, or low BP? Yes Did it involve sudden or severe rash/hives, skin peeling, or any reaction on the inside of your mouth or nose? No Did you need to seek medical attention at a hospital or doctor's office? Yes When did it last happen?early childhood If all  above answers are "NO", may proceed with cephalosporin use.      Medication List    STOP taking these medications   Doxylamine-Pyridoxine 10-10 MG Tbec Commonly known as: Diclegis   ferrous sulfate 325 (65 FE) MG tablet   ondansetron 4 MG disintegrating tablet Commonly known as: ZOFRAN-ODT   prenatal multivitamin Tabs tablet   Vitamin C 500 MG Caps     TAKE these medications   ibuprofen 600 MG tablet Commonly known as: ADVIL Take 1 tablet (600 mg total) by mouth every 6 (six) hours.       Diet: routine diet  Activity: Advance as tolerated. Pelvic rest for 6 weeks.   Outpatient follow up:4 weeks Follow up Appt: Future Appointments  Date Time Provider [place]  06/12/2019 12:10 PM [facility] NURSE [facility]  06/15/2019  9:30 AM AP-SCREENING [facility] None  06/15/2019  3:10 PM Roma Schanz, CNM [facility]  07/02/2019  3:10 PM Jonnie Kind, MD [facility]   Follow up Visit: Follow-up Information    [facility]. Schedule an appointment as soon as possible for a visit.   Specialty: Obstetrics and Gynecology Why: for 4-6 weeks from now for you postpartum visit Contact information: [address] [place] [phone]            Please schedule this patient for Postpartum visit in: 4 weeks with the following provider: Any provider Virtual For C/S patients schedule nurse incision check in weeks 2 weeks: no Low risk pregnancy complicated by:  Delivery mode:  SVD Anticipated Birth Control:  Plans Interval BTL PP Procedures needed:   Schedule Integrated [place] visit: no     Newborn Data: Live born female  Birth Weight:   APGAR: 64, 9  Newborn Delivery   Birth date/time: 06/09/2019 04:56:00 Delivery type: Vaginal, Spontaneous      Baby Feeding: Bottle Disposition:home with mother   06/10/2019 Roma Schanz, CNM

## 2019-06-09 NOTE — Progress Notes (Signed)
Patient on schedule for BTL today. Patient states she just signed [place] papers yesterday. Notified [facility] and will reschedule her procedure.

## 2019-06-09 NOTE — MAU Note (Signed)
.   Diane Medina is a 29 y.o. at [redacted]w[redacted]d here in MAU reporting: Ctx that started at 1300 yesterday afternoon. She states that they have been 5 minutes apart for the past three hours. No VB or LOF. She states that she has had bloody show since 0830 this morning. Patient endorses good fetal movement.   Pain score: 7 Vitals:   06/09/19 0254  BP: 90/70  Pulse: 84  Resp: 18  Temp: 98.1 F (36.7 C)     FHT:134 Lab orders placed from triage:

## 2019-06-10 MED ORDER — IBUPROFEN 600 MG PO TABS: 600.0000 mg | ORAL_TABLET | Freq: Four times a day (QID) | ORAL | 0 refills | Status: DC

## 2019-06-10 NOTE — Discharge Instructions (Signed)
NO SEX UNTIL AFTER YOU GET YOUR TUBES TIED  Postpartum Care After Vaginal Delivery This sheet gives you information about how to care for yourself from the time you deliver your baby to up to 6-12 weeks after delivery (postpartum period). Your health care provider may also give you more specific instructions. If you have problems or questions, contact your health care provider. Follow these instructions at home: Vaginal bleeding  It is normal to have vaginal bleeding (lochia) after delivery. Wear a sanitary pad for vaginal bleeding and discharge. ? During the first week after delivery, the amount and appearance of lochia is often similar to a menstrual period. ? Over the next few weeks, it will gradually decrease to a dry, yellow-brown discharge. ? For most women, lochia stops completely by 4-6 weeks after delivery. Vaginal bleeding can vary from woman to woman.  Change your sanitary pads frequently. Watch for any changes in your flow, such as: ? A sudden increase in volume. ? A change in color. ? Large blood clots.  If you pass a blood clot from your vagina, save it and call your health care provider to discuss. Do not flush blood clots down the toilet before talking with your health care provider.  Do not use tampons or douches until your health care provider says this is safe.  If you are not breastfeeding, your period should return 6-8 weeks after delivery. If you are feeding your child breast milk only (exclusive breastfeeding), your period may not return until you stop breastfeeding. Perineal care  Keep the area between the vagina and the anus (perineum) clean and dry as told by your health care provider. Use medicated pads and pain-relieving sprays and creams as directed.  If you had a cut in the perineum (episiotomy) or a tear in the vagina, check the area for signs of infection until you are healed. Check for: ? More redness, swelling, or pain. ? Fluid or blood coming from the  cut or tear. ? Warmth. ? Pus or a bad smell.  You may be given a squirt bottle to use instead of wiping to clean the perineum area after you go to the bathroom. As you start healing, you may use the squirt bottle before wiping yourself. Make sure to wipe gently.  To relieve pain caused by an episiotomy, a tear in the vagina, or swollen veins in the anus (hemorrhoids), try taking a warm sitz bath 2-3 times a day. A sitz bath is a warm water bath that is taken while you are sitting down. The water should only come up to your hips and should cover your buttocks. Breast care  Within the first few days after delivery, your breasts may feel heavy, full, and uncomfortable (breast engorgement). Milk may also leak from your breasts. Your health care provider can suggest ways to help relieve the discomfort. Breast engorgement should go away within a few days.  If you are breastfeeding: ? Wear a bra that supports your breasts and fits you well. ? Keep your nipples clean and dry. Apply creams and ointments as told by your health care provider. ? You may need to use breast pads to absorb milk that leaks from your breasts. ? You may have uterine contractions every time you breastfeed for up to several weeks after delivery. Uterine contractions help your uterus return to its normal size. ? If you have any problems with breastfeeding, work with your health care provider or Advertising copywriter.  If you are not breastfeeding: ?  Avoid touching your breasts a lot. Doing this can make your breasts produce more milk. ? Wear a good-fitting bra and use cold packs to help with swelling. ? Do not squeeze out (express) milk. This causes you to make more milk. Intimacy and sexuality  Ask your health care provider when you can engage in sexual activity. This may depend on: ? Your risk of infection. ? How fast you are healing. ? Your comfort and desire to engage in sexual activity.  You are able to get pregnant  after delivery, even if you have not had your period. If desired, talk with your health care provider about methods of birth control (contraception). Medicines  Take over-the-counter and prescription medicines only as told by your health care provider.  If you were prescribed an antibiotic medicine, take it as told by your health care provider. Do not stop taking the antibiotic even if you start to feel better. Activity  Gradually return to your normal activities as told by your health care provider. Ask your health care provider what activities are safe for you.  Rest as much as possible. Try to rest or take a nap while your baby is sleeping. Eating and drinking   Drink enough fluid to keep your urine pale yellow.  Eat high-fiber foods every day. These may help prevent or relieve constipation. High-fiber foods include: ? Whole grain cereals and breads. ? Brown rice. ? Beans. ? Fresh fruits and vegetables.  Do not try to lose weight quickly by cutting back on calories.  Take your prenatal vitamins until your postpartum checkup or until your health care provider tells you it is okay to stop. Lifestyle  Do not use any products that contain nicotine or tobacco, such as cigarettes and e-cigarettes. If you need help quitting, ask your health care provider.  Do not drink alcohol, especially if you are breastfeeding. General instructions  Keep all follow-up visits for you and your baby as told by your health care provider. Most women visit their health care provider for a postpartum checkup within the first 3-6 weeks after delivery. Contact a health care provider if:  You feel unable to cope with the changes that your child brings to your life, and these feelings do not go away.  You feel unusually sad or worried.  Your breasts become red, painful, or hard.  You have a fever.  You have trouble holding urine or keeping urine from leaking.  You have little or no interest in  activities you used to enjoy.  You have not breastfed at all and you have not had a menstrual period for 12 weeks after delivery.  You have stopped breastfeeding and you have not had a menstrual period for 12 weeks after you stopped breastfeeding.  You have questions about caring for yourself or your baby.  You pass a blood clot from your vagina. Get help right away if:  You have chest pain.  You have difficulty breathing.  You have sudden, severe leg pain.  You have severe pain or cramping in your lower abdomen.  You bleed from your vagina so much that you fill more than one sanitary pad in one hour. Bleeding should not be heavier than your heaviest period.  You develop a severe headache.  You faint.  You have blurred vision or spots in your vision.  You have bad-smelling vaginal discharge.  You have thoughts about hurting yourself or your baby. If you ever feel like you may hurt yourself or others,  or have thoughts about taking your own life, get help right away. You can go to the nearest emergency department or call:  Your local emergency services (911 in the [place]  A suicide crisis helpline, such as the National Suicide Prevention Lifeline at 256-777-3693. This is open 24 hours a day. Summary  The period of time right after you deliver your newborn up to 6-12 weeks after delivery is called the postpartum period.  Gradually return to your normal activities as told by your health care provider.  Keep all follow-up visits for you and your baby as told by your health care provider. This information is not intended to replace advice given to you by your health care provider. Make sure you discuss any questions you have with your health care provider. Document Revised: 03/22/2017 Document Reviewed: 12/31/2016 Elsevier Patient Education  2020 [employer].

## 2019-06-12 ENCOUNTER — Other Ambulatory Visit: Payer: Medicaid Other

## 2019-06-15 ENCOUNTER — Other Ambulatory Visit: Payer: Medicaid Other | Admitting: Women's Health

## 2019-06-15 ENCOUNTER — Other Ambulatory Visit (HOSPITAL_COMMUNITY)
Admission: RE | Admit: 2019-06-15 | Discharge: 2019-06-15 | Disposition: A | Discharge status: Home / Self Care | Payer: Medicaid Other | Source: Ambulatory Visit | Attending: Obstetrics & Gynecology | Admitting: Obstetrics & Gynecology

## 2019-06-15 ENCOUNTER — Other Ambulatory Visit: Payer: Self-pay

## 2019-06-16 ENCOUNTER — Inpatient Hospital Stay (HOSPITAL_COMMUNITY): Payer: Medicaid Other

## 2019-06-16 ENCOUNTER — Inpatient Hospital Stay (HOSPITAL_COMMUNITY)
Admission: AD | Admit: 2019-06-16 | Payer: Medicaid Other | Source: Home / Self Care | Admitting: Obstetrics & Gynecology

## 2019-07-02 ENCOUNTER — Encounter: Payer: Medicaid Other | Admitting: Obstetrics and Gynecology

## 2019-07-14 ENCOUNTER — Ambulatory Visit: Payer: Medicaid Other | Admitting: Women's Health

## 2019-07-15 ENCOUNTER — Ambulatory Visit: Payer: Medicaid Other | Admitting: Women's Health

## 2019-07-17 ENCOUNTER — Ambulatory Visit (INDEPENDENT_AMBULATORY_CARE_PROVIDER_SITE_OTHER): Payer: Medicaid Other | Admitting: Women's Health

## 2019-07-17 ENCOUNTER — Encounter: Payer: Self-pay | Admitting: Women's Health

## 2019-07-17 ENCOUNTER — Other Ambulatory Visit: Payer: Self-pay

## 2019-07-17 DIAGNOSIS — Z3009 Encounter for other general counseling and advice on contraception: Secondary | ICD-10-CM

## 2019-07-17 NOTE — Progress Notes (Signed)
POSTPARTUM VISIT Patient name: Diane Medina MRN [id number]  Date of birth: Sep 06, 1990 Chief Complaint:   Postpartum Care (discuss birth control)  History of Present Illness:   Diane Medina is a 29 y.o. G8P4004 Caucasian female being seen today for a postpartum visit. She is 5 weeks postpartum following a spontaneous vaginal delivery at 40 gestational weeks. Anesthesia: none. Laceration: none. I have fully reviewed the prenatal and intrapartum course. Pregnancy uncomplicated. Postpartum course has been uncomplicated. Bleeding stopped 2wks ago, spotting some now. Bowel function is normal. Bladder function is normal.  Patient is not sexually active. Last sexual activity: prior to birth of baby.  Contraception method is wants Nexplanon.    Last pap thinks it was during this pregnancy while in [place].  Results were normal .  No LMP recorded.  Baby's course has been uncomplicated. Baby is feeding by bottle    Edinburgh Postpartum Depression Screening: negative Edinburgh Postnatal Depression Scale - 07/17/19 1052      Edinburgh Postnatal Depression Scale:  In the Past 7 Days   I have been able to laugh and see the funny side of things.  0    I have looked forward with enjoyment to things.  0    I have blamed myself unnecessarily when things went wrong.  0    I have been anxious or worried for no good reason.  1    I have felt scared or panicky for no good reason.  0    Things have been getting on top of me.  1    I have been so unhappy that I have had difficulty sleeping.  0    I have felt sad or miserable.  0    I have been so unhappy that I have been crying.  0    The thought of harming myself has occurred to me.  0    Edinburgh Postnatal Depression Scale Total  2      Review of Systems:   Pertinent items are noted in HPI Denies Abnormal vaginal discharge w/ itching/odor/irritation, headaches, visual changes, shortness of breath, chest pain, abdominal pain, severe nausea/vomiting,  or problems with urination or bowel movements. Pertinent History Reviewed:  Reviewed past medical,surgical, obstetrical and family history.  Reviewed problem list, medications and allergies. OB History  Gravida Para Term Preterm AB Living  4 4 4     4   SAB TAB Ectopic Multiple Live Births        0 4    # Outcome Date GA Lbr Len/2nd Weight Sex Delivery Anes PTL Lv  4 Term 06/09/19 [redacted]w[redacted]d  7 lb 0.3 oz (3.184 kg) M Vag-Spont None  LIV  3 Term 12/29/13 [redacted]w[redacted]d  7 lb 12 oz (3.515 kg) F Vag-Spont None  LIV     Complications: Gestational diabetes  2 Term 10/22/12 [redacted]w[redacted]d  6 lb 6 oz (2.892 kg) F Vag-Spont Other N LIV     Complications: Gestational diabetes  1 Term 12/14/09 [redacted]w[redacted]d  7 lb 13 oz (3.544 kg) F Vag-Spont None N LIV   Physical Assessment:   Vitals:   07/17/19 1045  BP: 125/71  Pulse: 64  Weight: 262 lb (118.8 kg)  Height: 5\' 6"  (1.676 m)  Body mass index is 42.29 kg/m.       Physical Examination:   General appearance: alert, well appearing, and in no distress  Mental status: alert, oriented to person, place, and time  Skin: warm & dry   Cardiovascular: normal heart rate noted  Respiratory: normal respiratory effort, no distress   Breasts: deferred, no complaints   Abdomen: soft, non-tender   Pelvic: examination not indicated  Rectal: not examined   Extremities: no edema       No results found for this or any previous visit (from the past 24 hour(s)).  Assessment & Plan:  1) Postpartum exam 2) 5 wks s/p SVB 3) Bottlefeeding 4) Depression screening 5) Contraception counseling, pt prefers abstinence until Nexplanon   Meds: No orders of the defined types were placed in this encounter.   Follow-up: Return for 1st available, Nexplanon insertion.   No orders of the defined types were placed in this encounter.   Roma Schanz CNM, [facility] 07/17/2019 11:19 AM

## 2019-07-17 NOTE — Patient Instructions (Signed)
NO SEX UNTIL AFTER YOU GET YOUR BIRTH CONTROL  Etonogestrel implant What is this medicine? ETONOGESTREL (et oh noe JES trel) is a contraceptive (birth control) device. It is used to prevent pregnancy. It can be used for up to 3 years. This medicine may be used for other purposes; ask your health care provider or pharmacist if you have questions. COMMON BRAND NAME(S): Implanon, Nexplanon What should I tell my health care provider before I take this medicine? They need to know if you have any of these conditions:  abnormal vaginal bleeding  blood vessel disease or blood clots  breast, cervical, endometrial, ovarian, liver, or uterine cancer  diabetes  gallbladder disease  heart disease or recent heart attack  high blood pressure  high cholesterol or triglycerides  kidney disease  liver disease  migraine headaches  seizures  stroke  tobacco smoker  an unusual or allergic reaction to etonogestrel, anesthetics or antiseptics, other medicines, foods, dyes, or preservatives  pregnant or trying to get pregnant  breast-feeding How should I use this medicine? This device is inserted just under the skin on the inner side of your upper arm by a health care professional. Talk to your pediatrician regarding the use of this medicine in children. Special care may be needed. Overdosage: If you think you have taken too much of this medicine contact a poison control center or emergency room at once. NOTE: This medicine is only for you. Do not share this medicine with others. What if I miss a dose? This does not apply. What may interact with this medicine? Do not take this medicine with any of the following medications:  amprenavir  fosamprenavir This medicine may also interact with the following medications:  acitretin  aprepitant  armodafinil  bexarotene  bosentan  carbamazepine  certain medicines for fungal infections like fluconazole, ketoconazole, itraconazole  and voriconazole  certain medicines to treat hepatitis, HIV or AIDS  cyclosporine  felbamate  griseofulvin  lamotrigine  modafinil  oxcarbazepine  phenobarbital  phenytoin  primidone  rifabutin  rifampin  rifapentine  St. John's wort  topiramate This list may not describe all possible interactions. Give your health care provider a list of all the medicines, herbs, non-prescription drugs, or dietary supplements you use. Also tell them if you smoke, drink alcohol, or use illegal drugs. Some items may interact with your medicine. What should I watch for while using this medicine? This product does not protect you against HIV infection (AIDS) or other sexually transmitted diseases. You should be able to feel the implant by pressing your fingertips over the skin where it was inserted. Contact your doctor if you cannot feel the implant, and use a non-hormonal birth control method (such as condoms) until your doctor confirms that the implant is in place. Contact your doctor if you think that the implant may have broken or become bent while in your arm. You will receive a user card from your health care provider after the implant is inserted. The card is a record of the location of the implant in your upper arm and when it should be removed. Keep this card with your health records. What side effects may I notice from receiving this medicine? Side effects that you should report to your doctor or health care professional as soon as possible:  allergic reactions like skin rash, itching or hives, swelling of the face, lips, or tongue  breast lumps, breast tissue changes, or discharge  breathing problems  changes in emotions or moods  coughing up blood  if you feel that the implant may have broken or bent while in your arm  high blood pressure  pain, irritation, swelling, or bruising at the insertion site  scar at site of insertion  signs of infection at the insertion site  such as fever, and skin redness, pain or discharge  signs and symptoms of a blood clot such as breathing problems; changes in vision; chest pain; severe, sudden headache; pain, swelling, warmth in the leg; trouble speaking; sudden numbness or weakness of the face, arm or leg  signs and symptoms of liver injury like dark yellow or brown urine; general ill feeling or flu-like symptoms; light-colored stools; loss of appetite; nausea; right upper belly pain; unusually weak or tired; yellowing of the eyes or skin  unusual vaginal bleeding, discharge Side effects that usually do not require medical attention (report to your doctor or health care professional if they continue or are bothersome):  acne  breast pain or tenderness  headache  irregular menstrual bleeding  nausea This list may not describe all possible side effects. Call your doctor for medical advice about side effects. You may report side effects to FDA at 1-800-FDA-1088. Where should I keep my medicine? This drug is given in a hospital or clinic and will not be stored at home. NOTE: This sheet is a summary. It may not cover all possible information. If you have questions about this medicine, talk to your doctor, pharmacist, or health care provider.  2020 Elsevier/Gold Standard (2018-12-30 11:33:04)

## 2019-07-20 ENCOUNTER — Other Ambulatory Visit: Payer: Self-pay

## 2019-07-20 ENCOUNTER — Ambulatory Visit (INDEPENDENT_AMBULATORY_CARE_PROVIDER_SITE_OTHER): Payer: Medicaid Other | Admitting: Women's Health

## 2019-07-20 ENCOUNTER — Encounter: Payer: Self-pay | Admitting: Women's Health

## 2019-07-20 VITALS — BP 107/79 | HR 72 | Ht 66.0 in | Wt 259.2 lb

## 2019-07-20 DIAGNOSIS — Z3202 Encounter for pregnancy test, result negative: Secondary | ICD-10-CM

## 2019-07-20 DIAGNOSIS — Z30017 Encounter for initial prescription of implantable subdermal contraceptive: Secondary | ICD-10-CM | POA: Diagnosis not present

## 2019-07-20 LAB — POCT URINE PREGNANCY: Preg Test, Ur: NEGATIVE

## 2019-07-20 MED ORDER — ETONOGESTREL 68 MG ~~LOC~~ IMPL
68.0000 mg | DRUG_IMPLANT | Freq: Once | SUBCUTANEOUS | Status: AC
  Administered 2019-07-20: 68 mg via SUBCUTANEOUS

## 2019-07-20 NOTE — Progress Notes (Signed)
   NEXPLANON INSERTION Patient name: Diane Medina MRN [id number]  Date of birth: 30-Sep-1990 Subjective Findings:   Diane Medina is a 29 y.o. [phone] Caucasian female being seen today for insertion of a Nexplanon.  Patient's last menstrual period was 07/17/2019. Last sexual intercourse was prior to birth of baby Last pap 2020 during pregnancy in [place]. Results were:  normal  Risks/benefits/side effects of Nexplanon have been discussed and her questions have been answered.  Specifically, a failure rate of 04/998 has been reported, with an increased failure rate if pt takes St. John's Wort and/or antiseizure medicaitons.  She is aware of the common side effect of irregular bleeding, which the incidence of decreases over time. Signed copy of informed consent in chart.  Depression screen PHQ 2/9 01/08/2019  Decreased Interest 0  Down, Depressed, Hopeless 0  PHQ - 2 Score 0  Altered sleeping 1  Tired, decreased energy 1  Change in appetite 0  Feeling bad or failure about yourself  0  Trouble concentrating 0  Moving slowly or fidgety/restless 0  Suicidal thoughts 0  PHQ-9 Score 2    Pertinent History Reviewed:   Reviewed past medical,surgical, social, obstetrical and family history.  Reviewed problem list, medications and allergies. Objective Findings & Procedure:    Vitals:   07/20/19 0834  BP: 107/79  Pulse: 72  Weight: 259 lb 3.2 oz (117.6 kg)  Height: 5\' 6"  (1.676 m)  Body mass index is 41.84 kg/m.  Results for orders placed or performed in visit on 07/20/19 (from the past 24 hour(s))  POCT urine pregnancy   Collection Time: 07/20/19  8:42 AM  Result Value Ref Range   Preg Test, Ur Negative Negative     Time out was performed.  She is right-handed, so her left arm, approximately 10cm from the medial epicondyle and 3-5cm posterior to the sulcus, was cleansed with alcohol and anesthetized with 2cc of 2% Lidocaine.  The area was cleansed again with betadine and the Nexplanon  was inserted per manufacturer's recommendations without difficulty.  3 steri-strips and pressure bandage were applied. The patient tolerated the procedure well.  Assessment & Plan:   1) Nexplanon insertion Pt was instructed to keep the area clean and dry, remove pressure bandage in 24 hours, and keep insertion site covered with the steri-strip for 3-5 days.  Back up contraception was recommended for 2 weeks.  She was given a card indicating date Nexplanon was inserted and date it needs to be removed. Follow-up PRN problems.  Orders Placed This Encounter  Procedures  . POCT urine pregnancy    Follow-up: Return in about 1 year (around 07/19/2020) for Physical.  07/21/2020 CNM, [facility] 07/20/2019 8:59 AM

## 2019-07-20 NOTE — Patient Instructions (Signed)
Keep the area clean and dry.  You can remove the big bandage in 24 hours, and the small steri-strip bandage in 3-5 days.  A back up method, such as condoms, should be used for two weeks. You may have irregular vaginal bleeding for the first 6 months after the Nexplanon is placed, then the bleeding usually lightens and it is possible that you may not have any periods.  If you have any concerns, please give us a call.    Etonogestrel implant What is this medicine? ETONOGESTREL (et oh noe JES trel) is a contraceptive (birth control) device. It is used to prevent pregnancy. It can be used for up to 3 years. This medicine may be used for other purposes; ask your health care provider or pharmacist if you have questions. COMMON BRAND NAME(S): Implanon, Nexplanon What should I tell my health care provider before I take this medicine? They need to know if you have any of these conditions:  abnormal vaginal bleeding  blood vessel disease or blood clots  breast, cervical, endometrial, ovarian, liver, or uterine cancer  diabetes  gallbladder disease  heart disease or recent heart attack  high blood pressure  high cholesterol or triglycerides  kidney disease  liver disease  migraine headaches  seizures  stroke  tobacco smoker  an unusual or allergic reaction to etonogestrel, anesthetics or antiseptics, other medicines, foods, dyes, or preservatives  pregnant or trying to get pregnant  breast-feeding How should I use this medicine? This device is inserted just under the skin on the inner side of your upper arm by a health care professional. Talk to your pediatrician regarding the use of this medicine in children. Special care may be needed. Overdosage: If you think you have taken too much of this medicine contact a poison control center or emergency room at once. NOTE: This medicine is only for you. Do not share this medicine with others. What if I miss a dose? This does not  apply. What may interact with this medicine? Do not take this medicine with any of the following medications:  amprenavir  fosamprenavir This medicine may also interact with the following medications:  acitretin  aprepitant  armodafinil  bexarotene  bosentan  carbamazepine  certain medicines for fungal infections like fluconazole, ketoconazole, itraconazole and voriconazole  certain medicines to treat hepatitis, HIV or AIDS  cyclosporine  felbamate  griseofulvin  lamotrigine  modafinil  oxcarbazepine  phenobarbital  phenytoin  primidone  rifabutin  rifampin  rifapentine  St. John's wort  topiramate This list may not describe all possible interactions. Give your health care provider a list of all the medicines, herbs, non-prescription drugs, or dietary supplements you use. Also tell them if you smoke, drink alcohol, or use illegal drugs. Some items may interact with your medicine. What should I watch for while using this medicine? This product does not protect you against HIV infection (AIDS) or other sexually transmitted diseases. You should be able to feel the implant by pressing your fingertips over the skin where it was inserted. Contact your doctor if you cannot feel the implant, and use a non-hormonal birth control method (such as condoms) until your doctor confirms that the implant is in place. Contact your doctor if you think that the implant may have broken or become bent while in your arm. You will receive a user card from your health care provider after the implant is inserted. The card is a record of the location of the implant in your upper arm   and when it should be removed. Keep this card with your health records. What side effects may I notice from receiving this medicine? Side effects that you should report to your doctor or health care professional as soon as possible:  allergic reactions like skin rash, itching or hives, swelling of the  face, lips, or tongue  breast lumps, breast tissue changes, or discharge  breathing problems  changes in emotions or moods  coughing up blood  if you feel that the implant may have broken or bent while in your arm  high blood pressure  pain, irritation, swelling, or bruising at the insertion site  scar at site of insertion  signs of infection at the insertion site such as fever, and skin redness, pain or discharge  signs and symptoms of a blood clot such as breathing problems; changes in vision; chest pain; severe, sudden headache; pain, swelling, warmth in the leg; trouble speaking; sudden numbness or weakness of the face, arm or leg  signs and symptoms of liver injury like dark yellow or brown urine; general ill feeling or flu-like symptoms; light-colored stools; loss of appetite; nausea; right upper belly pain; unusually weak or tired; yellowing of the eyes or skin  unusual vaginal bleeding, discharge Side effects that usually do not require medical attention (report to your doctor or health care professional if they continue or are bothersome):  acne  breast pain or tenderness  headache  irregular menstrual bleeding  nausea This list may not describe all possible side effects. Call your doctor for medical advice about side effects. You may report side effects to FDA at 1-800-FDA-1088. Where should I keep my medicine? This drug is given in a hospital or clinic and will not be stored at home. NOTE: This sheet is a summary. It may not cover all possible information. If you have questions about this medicine, talk to your doctor, pharmacist, or health care provider.  2020 Elsevier/Gold Standard (2018-12-30 11:33:04)  

## 2019-07-23 ENCOUNTER — Telehealth: Payer: Self-pay | Admitting: Women's Health

## 2019-07-23 NOTE — Telephone Encounter (Signed)
Patient called, stated she was here earlier this week and she wanted to know how long she needs to leave the sticky tape like stuff on.  [phone]

## 2019-08-24 ENCOUNTER — Telehealth: Payer: Self-pay | Admitting: Women's Health

## 2019-08-24 ENCOUNTER — Telehealth: Payer: Self-pay | Admitting: Obstetrics & Gynecology

## 2019-08-24 MED ORDER — MEGESTROL ACETATE 40 MG PO TABS: ORAL_TABLET | ORAL | 3 refills | Status: DC

## 2019-08-24 NOTE — Telephone Encounter (Signed)
Pt is experiencing heavy bleeding and is requesting a medication that could help stop the bleeding. Pt states she spoke with someone last week and nothing was sent to her pharmacy. Pt wants to change pharmacy to [facility].

## 2019-08-24 NOTE — Telephone Encounter (Signed)
Pt having heavy bleeding on nexplanon. Would like megace sent in to help her stop bleeding.

## 2019-09-03 DIAGNOSIS — S93401A Sprain of unspecified ligament of right ankle, initial encounter: Secondary | ICD-10-CM | POA: Diagnosis not present

## 2019-09-03 DIAGNOSIS — S93601A Unspecified sprain of right foot, initial encounter: Secondary | ICD-10-CM | POA: Diagnosis not present

## 2019-09-03 DIAGNOSIS — S99911A Unspecified injury of right ankle, initial encounter: Secondary | ICD-10-CM | POA: Diagnosis not present

## 2019-11-09 ENCOUNTER — Emergency Department (HOSPITAL_COMMUNITY)
Admission: EM | Admit: 2019-11-09 | Discharge: 2019-11-10 | Disposition: A | Discharge status: Home / Self Care | Payer: Medicaid Other | Attending: Emergency Medicine | Admitting: Emergency Medicine

## 2019-11-09 ENCOUNTER — Emergency Department (HOSPITAL_COMMUNITY): Payer: Medicaid Other

## 2019-11-09 ENCOUNTER — Other Ambulatory Visit: Payer: Self-pay

## 2019-11-09 ENCOUNTER — Encounter (HOSPITAL_COMMUNITY): Payer: Self-pay | Admitting: *Deleted

## 2019-11-09 DIAGNOSIS — Z5321 Procedure and treatment not carried out due to patient leaving prior to being seen by health care provider: Secondary | ICD-10-CM | POA: Diagnosis not present

## 2019-11-09 DIAGNOSIS — M79642 Pain in left hand: Secondary | ICD-10-CM | POA: Diagnosis not present

## 2019-11-09 NOTE — ED Triage Notes (Signed)
Pt states she was grooming a dog and the dog bit her hand. States pain/swelling/bruising to the hand.  No puncture wounds. Has been taking ibuprofen without relief.

## 2020-06-12 DIAGNOSIS — S90822A Blister (nonthermal), left foot, initial encounter: Secondary | ICD-10-CM | POA: Diagnosis not present

## 2020-07-21 DIAGNOSIS — R059 Cough, unspecified: Secondary | ICD-10-CM | POA: Diagnosis not present

## 2020-07-21 DIAGNOSIS — J029 Acute pharyngitis, unspecified: Secondary | ICD-10-CM | POA: Diagnosis not present

## 2020-07-21 DIAGNOSIS — R0989 Other specified symptoms and signs involving the circulatory and respiratory systems: Secondary | ICD-10-CM | POA: Diagnosis not present

## 2020-07-21 DIAGNOSIS — R52 Pain, unspecified: Secondary | ICD-10-CM | POA: Diagnosis not present

## 2020-07-21 DIAGNOSIS — J40 Bronchitis, not specified as acute or chronic: Secondary | ICD-10-CM | POA: Diagnosis not present

## 2020-11-08 ENCOUNTER — Encounter (HOSPITAL_COMMUNITY): Payer: Self-pay

## 2020-11-08 ENCOUNTER — Other Ambulatory Visit: Payer: Self-pay

## 2020-11-08 ENCOUNTER — Emergency Department (HOSPITAL_COMMUNITY): Payer: No Typology Code available for payment source

## 2020-11-08 ENCOUNTER — Emergency Department (HOSPITAL_COMMUNITY)
Admission: EM | Admit: 2020-11-08 | Discharge: 2020-11-08 | Disposition: A | Discharge status: Home / Self Care | Payer: No Typology Code available for payment source | Attending: Emergency Medicine | Admitting: Emergency Medicine

## 2020-11-08 DIAGNOSIS — W540XXA Bitten by dog, initial encounter: Secondary | ICD-10-CM | POA: Diagnosis not present

## 2020-11-08 DIAGNOSIS — S61259A Open bite of unspecified finger without damage to nail, initial encounter: Secondary | ICD-10-CM

## 2020-11-08 DIAGNOSIS — S61214A Laceration without foreign body of right ring finger without damage to nail, initial encounter: Secondary | ICD-10-CM | POA: Insufficient documentation

## 2020-11-08 DIAGNOSIS — Y990 Civilian activity done for income or pay: Secondary | ICD-10-CM | POA: Diagnosis not present

## 2020-11-08 DIAGNOSIS — Y93K9 Activity, other involving animal care: Secondary | ICD-10-CM | POA: Insufficient documentation

## 2020-11-08 DIAGNOSIS — S6991XA Unspecified injury of right wrist, hand and finger(s), initial encounter: Secondary | ICD-10-CM | POA: Diagnosis present

## 2020-11-08 LAB — POCT PREGNANCY, URINE: Preg Test, Ur: NEGATIVE

## 2020-11-08 MED ORDER — METRONIDAZOLE 500 MG PO TABS: 500.0000 mg | ORAL_TABLET | Freq: Two times a day (BID) | ORAL | 0 refills | Status: DC

## 2020-11-08 MED ORDER — DOXYCYCLINE HYCLATE 100 MG PO TABS: 100.0000 mg | ORAL_TABLET | Freq: Once | ORAL | Status: DC

## 2020-11-08 MED ORDER — METRONIDAZOLE 500 MG PO TABS: 500.0000 mg | ORAL_TABLET | Freq: Once | ORAL | Status: DC

## 2020-11-08 MED ORDER — DOXYCYCLINE HYCLATE 100 MG PO CAPS: 100.0000 mg | ORAL_CAPSULE | Freq: Two times a day (BID) | ORAL | 0 refills | Status: DC

## 2020-11-08 MED ORDER — POVIDONE-IODINE 10 % EX SOLN: CUTANEOUS | Status: DC | PRN

## 2020-11-08 NOTE — ED Triage Notes (Addendum)
Pt. States they were bit by dog at work. Pt. Was bit on the 4th right finger. Pt. Is unsure if the dog has had a rabies vaccine.

## 2020-11-08 NOTE — Discharge Instructions (Addendum)
Use warm soapy water twice daily to clean your wound, dry and then cover.  Take the entire course of the antibiotics prescribed.  Get rechecked immediately for any signs of infection in your wounds including redness, swelling, increased pain or drainage of pus.  Your x-rays are negative for fracture or retained foreign body.  Your last tetanus was received in 2020 so you are current with this as well.

## 2020-11-08 NOTE — ED Notes (Signed)
Pt reports animal did have rabies vaccine up to date.

## 2020-11-09 NOTE — ED Provider Notes (Signed)
[facility] EMERGENCY DEPARTMENT Provider Note   CSN: [id number] Arrival date & time: 11/08/20  1141     History Chief Complaint  Patient presents with   Animal Bite    Diane Medina is a 30 y.o. female.  The history is provided by the patient.  Animal Bite Contact animal:  Dog Location:  Finger Finger injury location:  R ring finger Time since incident: just prior to arrival. Pain details:    Quality:  Aching and localized   Severity:  Mild   Timing:  Constant   Progression:  Unchanged Incident location:  Work (Pt works as a Museum/gallery conservator, was attempting to remove a dog from the exam room back to the kennel when he turned and snapped at her, catching her ring finger.) Provoked: provoked   Notifications:  Health department Animal's rabies vaccination status:  Up to date Animal in possession: yes   Tetanus status:  Up to date Relieved by:  None tried (she washed the wound with hibiclens prior to arrival.) Ineffective treatments:  None tried Associated symptoms: no fever and no swelling       Past Medical History:  Diagnosis Date   Gestational diabetes    during pregnancy    Patient Active Problem List   Diagnosis Date Noted   Nexplanon insertion 07/20/2019   History of gestational diabetes 01/08/2019    Past Surgical History:  Procedure Laterality Date   NO PAST SURGERIES       OB History     Gravida  4   Para  4   Term  4   Preterm      AB      Living  4      SAB      IAB      Ectopic      Multiple  0   Live Births  4           Family History  Problem Relation Age of Onset   Dilated cardiomyopathy Father    ADD / ADHD Brother    Autism Brother    Multiple sclerosis Paternal Aunt    Cancer Maternal Grandfather    Diabetes Paternal Grandmother    Autism Daughter     Social History   Tobacco Use   Smoking status: Never   Smokeless tobacco: Never  Vaping Use   Vaping Use: Never used  Substance Use Topics   Alcohol use:  No   Drug use: No    Home Medications Prior to Admission medications   Medication Sig Start Date End Date Taking? Authorizing Provider  doxycycline (VIBRAMYCIN) 100 MG capsule Take 1 capsule (100 mg total) by mouth 2 (two) times daily. 11/08/20  Yes Cynia Abruzzo, Raynelle Fanning, PA-C  metroNIDAZOLE (FLAGYL) 500 MG tablet Take 1 tablet (500 mg total) by mouth 2 (two) times daily. 11/08/20  Yes Analeese Andreatta, Raynelle Fanning, PA-C  ibuprofen (ADVIL) 600 MG tablet Take 1 tablet (600 mg total) by mouth every 6 (six) hours. Patient not taking: Reported on 07/17/2019 06/10/19   Cheral Marker, CNM  megestrol (MEGACE) 40 MG tablet 3 tablets a day for 5 days, 2 tablets a day for 5 days then 1 tablet daily 08/24/19   Lazaro Arms, MD    Allergies    Penicillins  Review of Systems   Review of Systems  Constitutional:  Negative for chills and fever.  Respiratory: Negative.    Cardiovascular: Negative.   Gastrointestinal: Negative.   Musculoskeletal:  Positive for arthralgias.  Skin:  Positive for wound.  Neurological: Negative.  Negative for weakness.   Physical Exam Updated Vital Signs BP 127/81   Pulse 63   Temp 98 F (36.7 C) (Oral)   Resp 18   Ht 5\' 5"  (1.651 m)   Wt 117.9 kg   SpO2 99%   BMI 43.27 kg/m   Physical Exam Constitutional:      Appearance: She is well-developed.  HENT:     Head: Normocephalic.  Cardiovascular:     Rate and Rhythm: Normal rate.  Pulmonary:     Effort: Pulmonary effort is normal.  Musculoskeletal:        General: Tenderness present.     Comments: Localized tenderness right proximal ring phalanx at site of superficial scraps and abrasions dorsal finger.  Distal sensation intact. Less than 2 sec cap refill.    Skin:    Capillary Refill: Capillary refill takes less than 2 seconds.     Findings: Laceration present.  Neurological:     Mental Status: She is alert and oriented to person, place, and time.     Sensory: No sensory deficit.     Motor: Motor function is intact.    ED  Results / Procedures / Treatments   Labs (all labs ordered are listed, but only abnormal results are displayed) Labs Reviewed  POC URINE PREG, ED  POCT PREGNANCY, URINE    EKG None  Radiology DG Finger Ring Right  Result Date: 11/08/2020 CLINICAL DATA:  Dog bite EXAM: RIGHT RING FINGER 2+V COMPARISON:  None. FINDINGS: Nonspecific soft tissue swelling. No evidence of fracture, radiopaque foreign object or air/gas in the tissues. IMPRESSION: Nonspecific soft tissue swelling. Electronically Signed   By: 01/08/2021 M.D.   On: 11/08/2020 15:37    Procedures Procedures   Medications Ordered in ED Medications - No data to display  ED Course  I have reviewed the triage vital signs and the nursing notes.  Pertinent labs & imaging results that were available during my care of the patient were reviewed by me and considered in my medical decision making (see chart for details).    MDM Rules/Calculators/A&P                           Pt with superficial linear abrasions dorsal right ring finger.  Imaging negative for fracture or fb.  Wound was soaked in betadine and saline solution.  Dressings applied.  She is current with her tetanus, last received here in 2020. PCN allergic so started on flagyl and doxycline for prophylaxis tx.  Referral to Dr. 2021 prn for any complications from this injury. Final Clinical Impression(s) / ED Diagnoses Final diagnoses:  Dog bite of finger, initial encounter    Rx / DC Orders ED Discharge Orders          Ordered    doxycycline (VIBRAMYCIN) 100 MG capsule  2 times daily        11/08/20 1600    metroNIDAZOLE (FLAGYL) 500 MG tablet  2 times daily        11/08/20 1600             01/08/21 11/09/20 1915    01/09/21, MD 11/10/20 1154

## 2021-03-16 DIAGNOSIS — M26621 Arthralgia of right temporomandibular joint: Secondary | ICD-10-CM | POA: Diagnosis not present

## 2021-05-04 ENCOUNTER — Other Ambulatory Visit: Payer: Self-pay

## 2021-05-04 ENCOUNTER — Emergency Department (HOSPITAL_COMMUNITY): Payer: Medicaid Other

## 2021-05-04 ENCOUNTER — Encounter (HOSPITAL_COMMUNITY): Payer: Self-pay

## 2021-05-04 ENCOUNTER — Emergency Department (HOSPITAL_COMMUNITY)
Admission: EM | Admit: 2021-05-04 | Discharge: 2021-05-04 | Disposition: A | Discharge status: Home / Self Care | Payer: Medicaid Other | Attending: Emergency Medicine | Admitting: Emergency Medicine

## 2021-05-04 DIAGNOSIS — M791 Myalgia, unspecified site: Secondary | ICD-10-CM | POA: Diagnosis not present

## 2021-05-04 DIAGNOSIS — R1110 Vomiting, unspecified: Secondary | ICD-10-CM | POA: Diagnosis not present

## 2021-05-04 DIAGNOSIS — Y990 Civilian activity done for income or pay: Secondary | ICD-10-CM | POA: Diagnosis not present

## 2021-05-04 DIAGNOSIS — X500XXA Overexertion from strenuous movement or load, initial encounter: Secondary | ICD-10-CM | POA: Insufficient documentation

## 2021-05-04 DIAGNOSIS — R0981 Nasal congestion: Secondary | ICD-10-CM | POA: Insufficient documentation

## 2021-05-04 DIAGNOSIS — R509 Fever, unspecified: Secondary | ICD-10-CM | POA: Diagnosis not present

## 2021-05-04 DIAGNOSIS — M79671 Pain in right foot: Secondary | ICD-10-CM | POA: Insufficient documentation

## 2021-05-04 DIAGNOSIS — M7989 Other specified soft tissue disorders: Secondary | ICD-10-CM | POA: Diagnosis not present

## 2021-05-04 DIAGNOSIS — J111 Influenza due to unidentified influenza virus with other respiratory manifestations: Secondary | ICD-10-CM | POA: Diagnosis not present

## 2021-05-04 DIAGNOSIS — Z20822 Contact with and (suspected) exposure to covid-19: Secondary | ICD-10-CM | POA: Diagnosis not present

## 2021-05-04 NOTE — Discharge Instructions (Addendum)
You were seen today for a flu like illness. Your influenza and Covid-19 tests are pending. I recommend supportive care including over the counter fever reducers and plenty of hydration. For your right foot no fracture was noted on x-ray. Follow up outpatient with Dr.Harrison, orthopedist on call if symptoms do not improve. Return to the emergency department if you develop shortness of breath, chest pain, or inability to drink fluids

## 2021-05-04 NOTE — ED Provider Notes (Signed)
[facility] EMERGENCY DEPARTMENT Provider Note   CSN: [id number] Arrival date & time: 05/04/21  2044     History  Chief Complaint  Patient presents with   Generalized Body Aches    Diane Medina is a 31 y.o. female. The patient presents to the emergency department tonight complaining of body aches, vomiting, fever, and congestion.  She states that she began to feel unwell last night and vomited twice this morning.  She has had body aches since the onset.  She states that multiple coworkers have called out sick the past week.  She also complained of right foot pain secondary to a heavy object at work rolling over her foot approximately 3 weeks ago.  She states that since she has no primary care and is unable to take off work she has not had the foot evaluated.  HPI     Home Medications Prior to Admission medications   Medication Sig Start Date End Date Taking? Authorizing Provider  doxycycline (VIBRAMYCIN) 100 MG capsule Take 1 capsule (100 mg total) by mouth 2 (two) times daily. 11/08/20   Burgess Amor, PA-C  ibuprofen (ADVIL) 600 MG tablet Take 1 tablet (600 mg total) by mouth every 6 (six) hours. Patient not taking: Reported on 07/17/2019 06/10/19   Cheral Marker, CNM  megestrol (MEGACE) 40 MG tablet 3 tablets a day for 5 days, 2 tablets a day for 5 days then 1 tablet daily 08/24/19   Lazaro Arms, MD  metroNIDAZOLE (FLAGYL) 500 MG tablet Take 1 tablet (500 mg total) by mouth 2 (two) times daily. 11/08/20   Burgess Amor, PA-C      Allergies    Penicillins    Review of Systems   Review of Systems  Constitutional:  Positive for chills and fever.  HENT:  Positive for congestion.   Respiratory:  Negative for cough and shortness of breath.   Cardiovascular:  Negative for chest pain.  Gastrointestinal:  Positive for nausea and vomiting. Negative for diarrhea.  Musculoskeletal:  Positive for myalgias.  Neurological:  Negative for headaches.   Physical Exam Updated Vital  Signs BP 127/89 (BP Location: Right Arm)    Pulse 68    Temp 98.3 F (36.8 C) (Oral)    Resp 17    Ht 5\' 6"  (1.676 m)    Wt 117.9 kg    SpO2 100%    BMI 41.97 kg/m  Physical Exam Constitutional:      General: She is not in acute distress.    Appearance: She is not diaphoretic.  HENT:     Head: Normocephalic and atraumatic.     Nose: Nose normal. No rhinorrhea.     Mouth/Throat:     Mouth: Mucous membranes are moist.     Pharynx: No oropharyngeal exudate or posterior oropharyngeal erythema.  Eyes:     Conjunctiva/sclera: Conjunctivae normal.  Cardiovascular:     Rate and Rhythm: Normal rate and regular rhythm.     Heart sounds: Normal heart sounds.  Pulmonary:     Effort: Pulmonary effort is normal.     Breath sounds: Normal breath sounds.  Abdominal:     General: Bowel sounds are normal.     Palpations: Abdomen is soft.     Tenderness: There is no abdominal tenderness.  Musculoskeletal:     Cervical back: Normal range of motion.  Skin:    General: Skin is warm and dry.  Neurological:     Mental Status: She is alert.  ED Results / Procedures / Treatments   Labs (all labs ordered are listed, but only abnormal results are displayed) Labs Reviewed  RESP PANEL BY RT-PCR (FLU A&B, COVID) [id number]    EKG None  Radiology DG Foot Complete Right  Result Date: 05/04/2021 CLINICAL DATA:  Crush injury 2 weeks ago, dorsal foot pain EXAM: RIGHT FOOT COMPLETE - 3+ VIEW COMPARISON:  None. FINDINGS: Frontal, oblique, and lateral views of the right foot are obtained. No fracture, subluxation, or dislocation. Joint spaces are well preserved. Mild dorsal soft tissue swelling. IMPRESSION: 1. Mild soft tissue swelling.  No acute fracture. Electronically Signed   By: Sharlet Salina M.D.   On: 05/04/2021 21:33    Procedures Procedures    Medications Ordered in ED Medications - No data to display  ED Course/ Medical Decision Making/ A&P                           Medical Decision  Making Amount and/or Complexity of Data Reviewed Radiology: ordered.   This patient presents to the ED for concern of body aches.  The differential diagnosis includes but is not limited to influenza infection, mononucleosis, covid-19, and others. She has a secondary and unrelated complaint of right foot pain. Differential diagnosis includes fracture, sprain, and strain   Co morbidities that complicate the patient evaluation  None   Additional history obtained:  None   Lab Tests:  I Ordered, and personally interpreted labs.  The pertinent results include:  Viral panels still pending   Imaging Studies ordered:  I ordered plain radiographs of the right foot.    Cardiac Monitoring:  None   Medicines ordered and prescription drug management:  No medications ordered during the visit I have reviewed the patients home medicines and have made adjustments as needed   Test Considered:  None   Critical Interventions:  None   Consultations Obtained:  None   Reevaluation:  After the interventions noted above, I reevaluated the patient and found that they have :stayed the same   Social Determinants of Health:  No primary care provider   Dispostion:  After consideration of the diagnostic results and the patients response to treatment, I feel that the patent would benefit from discharge home.  She will follow her lab results on mychart. I recommended supportive care with over the counter medication for fever. Recommended continued hydration. I see no reason to consider hospital admission at this time. Return precautions provided.    Final Clinical Impression(s) / ED Diagnoses Final diagnoses:  Influenza-like illness    Rx / DC Orders ED Discharge Orders     None         Darrick Grinder, [place] 05/04/21 2310    Eber Hong, MD 05/06/21 [phone]

## 2021-05-04 NOTE — ED Triage Notes (Signed)
Pt reports generalized body aches that started yesterday, reports generalized weakness, sore throat, vomited twice today after eating. Pt oral temp in triage is 98.3.

## 2021-05-05 ENCOUNTER — Telehealth: Payer: Self-pay

## 2021-05-05 LAB — RESP PANEL BY RT-PCR (FLU A&B, COVID) ARPGX2
Influenza A by PCR: NEGATIVE
Influenza B by PCR: NEGATIVE
SARS Coronavirus 2 by RT PCR: NEGATIVE

## 2021-05-05 NOTE — Telephone Encounter (Signed)
Transition Care Management Unsuccessful Follow-up Telephone Call  Date of discharge and from where:  05/04/2021 from [facility]  Attempts:  1st Attempt  Reason for unsuccessful TCM follow-up call:  Left voice message

## 2021-05-08 NOTE — Telephone Encounter (Signed)
Transition Care Management Follow-up Telephone Call Date of discharge and from where: 05/04/2021 - [facility] How have you been since you were released from the hospital? "Doing okay, I guess" Any questions or concerns? No  Items Reviewed: Did the pt receive and understand the discharge instructions provided? Yes  Medications obtained and verified?  N/A Other? No  Any new allergies since your discharge? No  Dietary orders reviewed? No Do you have support at home? Yes    Functional Questionnaire: (I = Independent and D = Dependent) ADLs: I  Bathing/Dressing- I  Meal Prep- I  Eating- I  Maintaining continence- I  Transferring/Ambulation- I  Managing Meds- I  Follow up appointments reviewed:  PCP Hospital f/u appt confirmed? No  - No PCP Specialist Hospital f/u appt confirmed? No  Are transportation arrangements needed? No  If their condition worsens, is the pt aware to call PCP or go to the Emergency Dept.? Yes Was the patient provided with contact information for the PCP's office or ED? Yes Was to pt encouraged to call back with questions or concerns? Yes

## 2021-05-18 DIAGNOSIS — J029 Acute pharyngitis, unspecified: Secondary | ICD-10-CM | POA: Diagnosis not present

## 2021-05-18 DIAGNOSIS — R52 Pain, unspecified: Secondary | ICD-10-CM | POA: Diagnosis not present

## 2021-05-18 DIAGNOSIS — R059 Cough, unspecified: Secondary | ICD-10-CM | POA: Diagnosis not present

## 2021-05-18 DIAGNOSIS — J020 Streptococcal pharyngitis: Secondary | ICD-10-CM | POA: Diagnosis not present

## 2021-11-06 IMAGING — DX DG FINGER RING 2+V*R*
3 series · 3 of 3 positions shown · non-contrast
Comparison: None.

CLINICAL DATA: Dog bite

EXAM:
RIGHT RING FINGER 2+V

[finger ap]
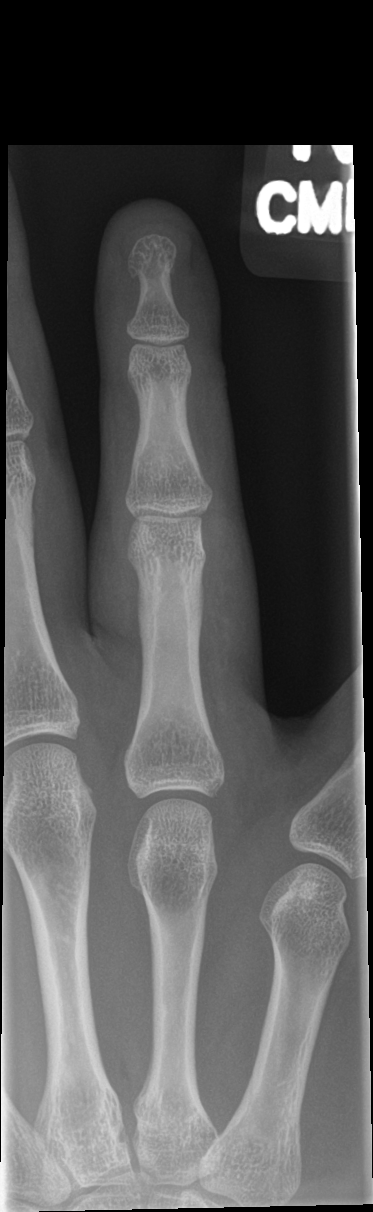

[finger obl]
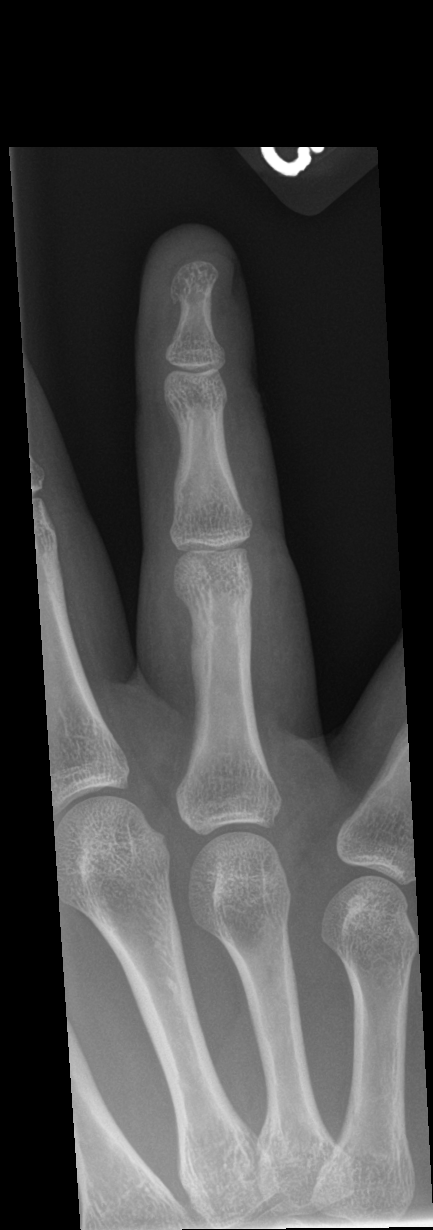

[finger lat]
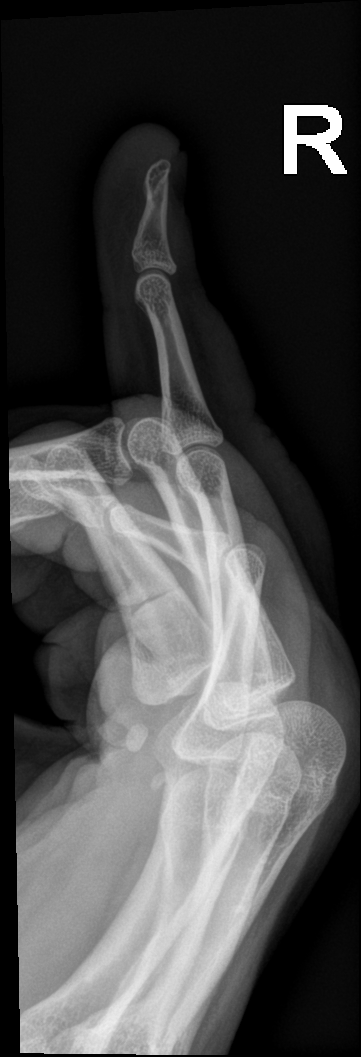

[3 of 3 positions shown; findings below may reference images not displayed]

FINDINGS: Nonspecific soft tissue swelling. No evidence of fracture,
radiopaque foreign object or air/gas in the tissues.
IMPRESSION: Nonspecific soft tissue swelling.

## 2021-12-14 DIAGNOSIS — B850 Pediculosis due to Pediculus humanus capitis: Secondary | ICD-10-CM | POA: Diagnosis not present

## 2021-12-20 ENCOUNTER — Other Ambulatory Visit: Payer: Self-pay

## 2021-12-20 ENCOUNTER — Emergency Department (HOSPITAL_COMMUNITY)
Admission: EM | Admit: 2021-12-20 | Discharge: 2021-12-20 | Disposition: A | Discharge status: Home / Self Care | Payer: Medicaid Other | Attending: Emergency Medicine | Admitting: Emergency Medicine

## 2021-12-20 ENCOUNTER — Encounter (HOSPITAL_COMMUNITY): Payer: Self-pay | Admitting: Emergency Medicine

## 2021-12-20 ENCOUNTER — Emergency Department (HOSPITAL_COMMUNITY): Payer: Medicaid Other

## 2021-12-20 DIAGNOSIS — N10 Acute tubulo-interstitial nephritis: Secondary | ICD-10-CM | POA: Diagnosis not present

## 2021-12-20 DIAGNOSIS — N390 Urinary tract infection, site not specified: Secondary | ICD-10-CM | POA: Diagnosis not present

## 2021-12-20 DIAGNOSIS — N12 Tubulo-interstitial nephritis, not specified as acute or chronic: Secondary | ICD-10-CM | POA: Diagnosis not present

## 2021-12-20 DIAGNOSIS — R109 Unspecified abdominal pain: Secondary | ICD-10-CM | POA: Diagnosis present

## 2021-12-20 DIAGNOSIS — R10A2 Flank pain, left side: Secondary | ICD-10-CM

## 2021-12-20 DIAGNOSIS — N3289 Other specified disorders of bladder: Secondary | ICD-10-CM | POA: Diagnosis not present

## 2021-12-20 DIAGNOSIS — K429 Umbilical hernia without obstruction or gangrene: Secondary | ICD-10-CM | POA: Diagnosis not present

## 2021-12-20 DIAGNOSIS — R162 Hepatomegaly with splenomegaly, not elsewhere classified: Secondary | ICD-10-CM | POA: Diagnosis not present

## 2021-12-20 DIAGNOSIS — R1032 Left lower quadrant pain: Secondary | ICD-10-CM | POA: Diagnosis not present

## 2021-12-20 DIAGNOSIS — N132 Hydronephrosis with renal and ureteral calculous obstruction: Secondary | ICD-10-CM | POA: Diagnosis not present

## 2021-12-20 LAB — CBC WITH DIFFERENTIAL/PLATELET
Abs Immature Granulocytes: 0.01 10*3/uL (ref 0.00–0.07)
Basophils Absolute: 0 10*3/uL (ref 0.0–0.1)
Basophils Relative: 1 %
Eosinophils Absolute: 0 10*3/uL (ref 0.0–0.5)
Eosinophils Relative: 0 %
HCT: 37.7 % (ref 36.0–46.0)
Hemoglobin: 12 g/dL (ref 12.0–15.0)
Immature Granulocytes: 0 %
Lymphocytes Relative: 27 %
Lymphs Abs: 1.6 10*3/uL (ref 0.7–4.0)
MCH: 24.9 pg — ABNORMAL LOW (ref 26.0–34.0)
MCHC: 31.8 g/dL (ref 30.0–36.0)
MCV: 78.4 fL — ABNORMAL LOW (ref 80.0–100.0)
Monocytes Absolute: 0.6 10*3/uL (ref 0.1–1.0)
Monocytes Relative: 10 %
Neutro Abs: 3.7 10*3/uL (ref 1.7–7.7)
Neutrophils Relative %: 62 %
Platelets: 158 10*3/uL (ref 150–400)
RBC: 4.81 MIL/uL (ref 3.87–5.11)
RDW: 14.3 % (ref 11.5–15.5)
WBC: 5.8 10*3/uL (ref 4.0–10.5)
nRBC: 0 % (ref 0.0–0.2)

## 2021-12-20 LAB — PREGNANCY, URINE: Preg Test, Ur: NEGATIVE

## 2021-12-20 LAB — URINALYSIS, ROUTINE W REFLEX MICROSCOPIC
Bilirubin Urine: NEGATIVE
Glucose, UA: NEGATIVE mg/dL
Ketones, ur: NEGATIVE mg/dL
Nitrite: NEGATIVE
Protein, ur: 30 mg/dL — AB
Specific Gravity, Urine: 1.027 (ref 1.005–1.030)
Trans Epithel, UA: 3
WBC, UA: >50 WBC/hpf — ABNORMAL HIGH (ref 0–5)
pH: 5 (ref 5.0–8.0)

## 2021-12-20 LAB — BASIC METABOLIC PANEL
Anion gap: 7 (ref 5–15)
BUN: 16 mg/dL (ref 6–20)
CO2: 24 mmol/L (ref 22–32)
Calcium: 8.9 mg/dL (ref 8.9–10.3)
Chloride: 107 mmol/L (ref 98–111)
Creatinine, Ser: 0.75 mg/dL (ref 0.44–1.00)
GFR, Estimated: >60 mL/min (ref >60)
Glucose, Bld: 101 mg/dL — ABNORMAL HIGH (ref 70–99)
Potassium: 4 mmol/L (ref 3.5–5.1)
Sodium: 138 mmol/L (ref 135–145)

## 2021-12-20 MED ORDER — MORPHINE SULFATE (PF) 4 MG/ML IV SOLN
4.0000 mg | Freq: Once | INTRAVENOUS | Status: AC
  Administered 2021-12-20: 4 mg via INTRAVENOUS
  Filled 2021-12-20: qty 1

## 2021-12-20 MED ORDER — ONDANSETRON HCL 4 MG/2ML IJ SOLN
4.0000 mg | Freq: Once | INTRAMUSCULAR | Status: AC
  Administered 2021-12-20: 4 mg via INTRAVENOUS
  Filled 2021-12-20: qty 2

## 2021-12-20 MED ORDER — KETOROLAC TROMETHAMINE 30 MG/ML IJ SOLN
30.0000 mg | Freq: Once | INTRAMUSCULAR | Status: AC
  Administered 2021-12-20: 30 mg via INTRAVENOUS
  Filled 2021-12-20: qty 1

## 2021-12-20 MED ORDER — CIPROFLOXACIN HCL 500 MG PO TABS: 500.0000 mg | ORAL_TABLET | Freq: Two times a day (BID) | ORAL | 0 refills | Status: DC

## 2021-12-20 MED ORDER — CIPROFLOXACIN IN D5W 400 MG/200ML IV SOLN
400.0000 mg | Freq: Once | INTRAVENOUS | Status: AC
  Administered 2021-12-20: 400 mg via INTRAVENOUS
  Filled 2021-12-20: qty 200

## 2021-12-20 NOTE — ED Triage Notes (Signed)
Pt c/o left flank pain since yesterday and bilateral feet swelling.

## 2021-12-20 NOTE — ED Provider Notes (Signed)
[facility] EMERGENCY DEPARTMENT Provider Note   CSN: [id number] Arrival date & time: 12/20/21  0103     History  Chief Complaint  Patient presents with   Flank Pain    Diane Medina is a 31 y.o. female.  Patient is a 31 year old female with past medical history of gestational diabetes.  Patient presenting today with complaints of left flank pain.  This began earlier today in the absence of any injury or trauma.  She describes a constant ache to the left flank with no radiation into the legs.  She denies any abdominal pain.  She denies any dysuria or hematuria.  Pain is worse with palpation and movement with no alleviating factors.  Patient also complains of a several day history of bilateral foot swelling.  She reports her shoes not fitting secondary to this.  She denies any injury or trauma.  The history is provided by the patient.       Home Medications Prior to Admission medications   Medication Sig Start Date End Date Taking? Authorizing Provider  doxycycline (VIBRAMYCIN) 100 MG capsule Take 1 capsule (100 mg total) by mouth 2 (two) times daily. 11/08/20   Burgess Amor, PA-C  ibuprofen (ADVIL) 600 MG tablet Take 1 tablet (600 mg total) by mouth every 6 (six) hours. Patient not taking: Reported on 07/17/2019 06/10/19   Cheral Marker, CNM  megestrol (MEGACE) 40 MG tablet 3 tablets a day for 5 days, 2 tablets a day for 5 days then 1 tablet daily 08/24/19   Lazaro Arms, MD  metroNIDAZOLE (FLAGYL) 500 MG tablet Take 1 tablet (500 mg total) by mouth 2 (two) times daily. 11/08/20   Burgess Amor, PA-C      Allergies    Penicillins    Review of Systems   Review of Systems  All other systems reviewed and are negative.   Physical Exam Updated Vital Signs BP 122/79   Pulse 78   Temp 98.1 F (36.7 C) (Oral)   Resp 16   Ht 5\' 6"  (1.676 m)   Wt 131.5 kg   SpO2 100%   BMI 46.81 kg/m  Physical Exam Vitals and nursing note reviewed.  Constitutional:      General: She is  not in acute distress.    Appearance: She is well-developed. She is not diaphoretic.  HENT:     Head: Normocephalic and atraumatic.  Cardiovascular:     Rate and Rhythm: Normal rate and regular rhythm.     Heart sounds: No murmur heard.    No friction rub. No gallop.  Pulmonary:     Effort: Pulmonary effort is normal. No respiratory distress.     Breath sounds: Normal breath sounds. No wheezing.  Abdominal:     General: Bowel sounds are normal. There is no distension.     Palpations: Abdomen is soft.     Tenderness: There is no abdominal tenderness. There is left CVA tenderness. There is no right CVA tenderness, guarding or rebound.  Musculoskeletal:        General: Normal range of motion.     Cervical back: Normal range of motion and neck supple.  Skin:    General: Skin is warm and dry.  Neurological:     General: No focal deficit present.     Mental Status: She is alert and oriented to person, place, and time.     ED Results / Procedures / Treatments   Labs (all labs ordered are listed, but only abnormal results  are displayed) Labs Reviewed  URINALYSIS, ROUTINE W REFLEX MICROSCOPIC  PREGNANCY, URINE  CBC WITH DIFFERENTIAL/PLATELET  BASIC METABOLIC PANEL    EKG None  Radiology No results found.  Procedures Procedures    Medications Ordered in ED Medications  ketorolac (TORADOL) 30 MG/ML injection 30 mg (has no administration in time range)  morphine (PF) 4 MG/ML injection 4 mg (has no administration in time range)  ondansetron (ZOFRAN) injection 4 mg (has no administration in time range)    ED Course/ Medical Decision Making/ A&P  This patient presents to the ED for concern of left flank pain, this involves an extensive number of treatment options, and is a complaint that carries with it a high risk of complications and morbidity.  The differential diagnosis includes pyelonephritis, renal calculus, musculoskeletal etiology   Co morbidities that complicate  the patient evaluation  None   Additional history obtained:  No additional history or external records needed   Lab Tests:  I Ordered, and personally interpreted labs.  The pertinent results include: Unremarkable CBC and metabolic panel.  Urinalysis is consistent with a UTI   Imaging Studies ordered:  I ordered imaging studies including CT renal I independently visualized and interpreted imaging which showed no renal calculus, but evidence for possible a sending cystitis I agree with the radiologist interpretation   Cardiac Monitoring: / EKG:  None performed   Consultations Obtained:  No consultations obtained or indicated   Problem List / ED Course / Critical interventions / Medication management  Patient presenting with left flank pain that looks to be caused by a urinary tract infection.  She has evidence for UTI and her urine and findings consistent with this on CT scan.  Patient was given Toradol, morphine, and Zofran and seems to be feeling better.  She was also given Cipro intravenously.  Patient will be discharged with Cipro and as needed return. I have reviewed the patients home medicines and have made adjustments as needed   Social Determinants of Health:  None   Test / Admission - Considered:  No emergent pathology identified that would necessitate admission.  Final Clinical Impression(s) / ED Diagnoses Final diagnoses:  None    Rx / DC Orders ED Discharge Orders     None         Veryl Speak, MD 12/20/21 [phone]

## 2021-12-20 NOTE — ED Notes (Signed)
Patient verbalizes understanding of discharge instructions. Opportunity for questioning and answers were provided. Armband removed by staff, pt discharged from ED. Ambulated out to lobby  

## 2021-12-20 NOTE — Discharge Instructions (Signed)
Begin taking Cipro as prescribed.  Take ibuprofen 600 mg every 6 hours as needed for pain.  Return to the emergency department if symptoms significantly worsen or change.

## 2022-02-27 ENCOUNTER — Encounter: Payer: Medicaid Other | Admitting: Women's Health

## 2022-03-18 ENCOUNTER — Emergency Department (HOSPITAL_COMMUNITY): Payer: Medicaid Other

## 2022-03-18 ENCOUNTER — Emergency Department (HOSPITAL_COMMUNITY)
Admission: EM | Admit: 2022-03-18 | Discharge: 2022-03-18 | Disposition: A | Discharge status: Home / Self Care | Payer: Medicaid Other | Attending: Emergency Medicine | Admitting: Emergency Medicine

## 2022-03-18 ENCOUNTER — Encounter (HOSPITAL_COMMUNITY): Payer: Self-pay | Admitting: *Deleted

## 2022-03-18 ENCOUNTER — Other Ambulatory Visit: Payer: Self-pay

## 2022-03-18 DIAGNOSIS — R600 Localized edema: Secondary | ICD-10-CM | POA: Diagnosis not present

## 2022-03-18 DIAGNOSIS — M7989 Other specified soft tissue disorders: Secondary | ICD-10-CM | POA: Diagnosis present

## 2022-03-18 DIAGNOSIS — R609 Edema, unspecified: Secondary | ICD-10-CM

## 2022-03-18 DIAGNOSIS — R0602 Shortness of breath: Secondary | ICD-10-CM | POA: Diagnosis not present

## 2022-03-18 LAB — CBC WITH DIFFERENTIAL/PLATELET
Abs Immature Granulocytes: 0.01 10*3/uL (ref 0.00–0.07)
Basophils Absolute: 0 10*3/uL (ref 0.0–0.1)
Basophils Relative: 0 %
Eosinophils Absolute: 0 10*3/uL (ref 0.0–0.5)
Eosinophils Relative: 0 %
HCT: 38.6 % (ref 36.0–46.0)
Hemoglobin: 12.6 g/dL (ref 12.0–15.0)
Immature Granulocytes: 0 %
Lymphocytes Relative: 21 %
Lymphs Abs: 1.2 10*3/uL (ref 0.7–4.0)
MCH: 25.2 pg — ABNORMAL LOW (ref 26.0–34.0)
MCHC: 32.6 g/dL (ref 30.0–36.0)
MCV: 77.2 fL — ABNORMAL LOW (ref 80.0–100.0)
Monocytes Absolute: 0.5 10*3/uL (ref 0.1–1.0)
Monocytes Relative: 9 %
Neutro Abs: 3.9 10*3/uL (ref 1.7–7.7)
Neutrophils Relative %: 70 %
Platelets: 185 10*3/uL (ref 150–400)
RBC: 5 MIL/uL (ref 3.87–5.11)
RDW: 14.2 % (ref 11.5–15.5)
WBC: 5.6 10*3/uL (ref 4.0–10.5)
nRBC: 0 % (ref 0.0–0.2)

## 2022-03-18 LAB — URINALYSIS, ROUTINE W REFLEX MICROSCOPIC
Bilirubin Urine: NEGATIVE
Glucose, UA: NEGATIVE mg/dL
Ketones, ur: NEGATIVE mg/dL
Nitrite: NEGATIVE
Protein, ur: NEGATIVE mg/dL
Specific Gravity, Urine: 1.025 (ref 1.005–1.030)
pH: 5 (ref 5.0–8.0)

## 2022-03-18 LAB — COMPREHENSIVE METABOLIC PANEL
ALT: 14 U/L (ref 0–44)
AST: 15 U/L (ref 15–41)
Albumin: 3.5 g/dL (ref 3.5–5.0)
Alkaline Phosphatase: 77 U/L (ref 38–126)
Anion gap: 6 (ref 5–15)
BUN: 12 mg/dL (ref 6–20)
CO2: 26 mmol/L (ref 22–32)
Calcium: 8.7 mg/dL — ABNORMAL LOW (ref 8.9–10.3)
Chloride: 105 mmol/L (ref 98–111)
Creatinine, Ser: 0.72 mg/dL (ref 0.44–1.00)
GFR, Estimated: >60 mL/min (ref >60)
Glucose, Bld: 112 mg/dL — ABNORMAL HIGH (ref 70–99)
Potassium: 3.5 mmol/L (ref 3.5–5.1)
Sodium: 137 mmol/L (ref 135–145)
Total Bilirubin: 1 mg/dL (ref 0.3–1.2)
Total Protein: 7.3 g/dL (ref 6.5–8.1)

## 2022-03-18 LAB — D-DIMER, QUANTITATIVE: D-Dimer, Quant: 0.41 ug/mL-FEU (ref 0.00–0.50)

## 2022-03-18 LAB — PREGNANCY, URINE: Preg Test, Ur: NEGATIVE

## 2022-03-18 NOTE — ED Provider Triage Note (Signed)
Emergency Medicine Provider Triage Evaluation Note  Diane Medina , a 31 y.o. female  was evaluated in triage.  Pt complains of swelling bilat lower legs. Pt complains of feeling swollen and tight all over.  Pt reports she tok a 2 hour nap and awoke with swelling   Review of Systems  Positive: Leg swelling  Negative: Fever   Physical Exam  BP 122/73 (BP Location: Right Arm)   Pulse 84   Temp 98.3 F (36.8 C) (Oral)   Resp 16   Ht 5\' 5"  (1.651 m)   Wt 127 kg   SpO2 100%   BMI 46.59 kg/m  Gen:   Awake, no distress   Resp:  Normal effort  MSK:   Moves extremities without difficulty  Other:  Swelling bilat lower legs.    Medical Decision Making  Medically screening exam initiated at 7:09 PM.  Appropriate orders placed.  Mallisa Alameda was informed that the remainder of the evaluation will be completed by another provider, this initial triage assessment does not replace that evaluation, and the importance of remaining in the ED until their evaluation is complete.     Jake Shark, [place]son Areas 03/18/22 1910

## 2022-03-18 NOTE — ED Notes (Signed)
Left foot 3+ pitting edema , unable to palpated pulse, pulse doppler performed on left dorsalis pedis  artery,  audible sound present

## 2022-03-18 NOTE — ED Triage Notes (Signed)
Pt reports after she woke up from a nap this afternoon she noticed that both her lower legs and feet were swollen and felt like "pins and needles". Pt also reports her whole body feels "heavy" and that she is slightly SOB. Pt reports prior to taking a nap today she was fine and had none of these problems.

## 2022-03-18 NOTE — Discharge Instructions (Addendum)
Elevate lower extremities.  Follow up with your Physician for recheck.

## 2022-03-18 NOTE — ED Provider Notes (Signed)
[place] Provider Note   CSN: [id number] Arrival date & time: 03/18/22  1744     History  Chief Complaint  Patient presents with   Foot Swelling    Diane Medina is a 31 y.o. female.  Patient reports she took a 2-hour nap today and when she awoke from sleep she had swelling in both of her lower legs.  Patient reports her feet are more swollen than usual.  Patient reports she has a heavy feeling in her body.  Patient reports both legs are swollen.  Patient denies any changes in diet.  Patient reports that she normally stands at work for 12 hours a day.  Patient reports her legs were normal prior to going to sleep.  Patient denies any recent travel.  Patient denies any fever or chills.  She has not had any cough or congestion  The history is provided by the patient. No language interpreter was used.       Home Medications Prior to Admission medications   Medication Sig Start Date End Date Taking? Authorizing Provider  ciprofloxacin (CIPRO) 500 MG tablet Take 1 tablet (500 mg total) by mouth 2 (two) times daily. One po bid x 7 days 12/20/21   Veryl Speak, MD  doxycycline (VIBRAMYCIN) 100 MG capsule Take 1 capsule (100 mg total) by mouth 2 (two) times daily. 11/08/20   Evalee Jefferson, PA-C  ibuprofen (ADVIL) 600 MG tablet Take 1 tablet (600 mg total) by mouth every 6 (six) hours. Patient not taking: Reported on 07/17/2019 06/10/19   Roma Schanz, CNM  megestrol (MEGACE) 40 MG tablet 3 tablets a day for 5 days, 2 tablets a day for 5 days then 1 tablet daily 08/24/19   Florian Buff, MD  metroNIDAZOLE (FLAGYL) 500 MG tablet Take 1 tablet (500 mg total) by mouth 2 (two) times daily. 11/08/20   Evalee Jefferson, PA-C      Allergies    Penicillins    Review of Systems   Review of Systems  All other systems reviewed and are negative.   Physical Exam Updated Vital Signs BP (!) 118/57 (BP Location: Right Arm)   Pulse 74   Temp 98.3 F (36.8 C) (Oral)   Resp 16    Ht 5\' 5"  (1.651 m)   Wt 127 kg   SpO2 100%   BMI 46.59 kg/m  Physical Exam Vitals and nursing note reviewed.  Constitutional:      Appearance: She is well-developed.  HENT:     Head: Normocephalic.  Pulmonary:     Effort: Pulmonary effort is normal.  Abdominal:     General: There is no distension.  Musculoskeletal:        General: Swelling present. Normal range of motion.     Cervical back: Normal range of motion.     Right lower leg: Edema present.     Left lower leg: Edema present.     Comments: Swollen bilateral lower legs equally.  No pitting.  [name] is negative bilaterally.  Patient has good pulses.  Neurological:     Mental Status: She is alert and oriented to person, place, and time.     ED Results / Procedures / Treatments   Labs (all labs ordered are listed, but only abnormal results are displayed) Labs Reviewed  CBC WITH DIFFERENTIAL/PLATELET - Abnormal; Notable for the following components:      Result Value   MCV 77.2 (*)    MCH 25.2 (*)    All  other components within normal limits  COMPREHENSIVE METABOLIC PANEL - Abnormal; Notable for the following components:   Glucose, Bld 112 (*)    Calcium 8.7 (*)    All other components within normal limits  URINALYSIS, ROUTINE W REFLEX MICROSCOPIC - Abnormal; Notable for the following components:   APPearance HAZY (*)    Hgb urine dipstick SMALL (*)    Leukocytes,Ua SMALL (*)    Bacteria, UA RARE (*)    All other components within normal limits  D-DIMER, QUANTITATIVE  PREGNANCY, URINE    EKG None  Radiology DG Chest 2 View  Result Date: 03/18/2022 CLINICAL DATA:  Shortness of breath and bilateral lower extremity swelling EXAM: CHEST - 2 VIEW COMPARISON:  None Available. FINDINGS: The heart size and mediastinal contours are within normal limits. No focal pulmonary opacity. No pleural effusion or pneumothorax. The visualized upper abdomen is unremarkable. No acute osseous abnormality. IMPRESSION: No acute  cardiopulmonary abnormality. Electronically Signed   By: Jacob Moores M.D.   On: 03/18/2022 18:52    Procedures Procedures    Medications Ordered in ED Medications - No data to display  ED Course/ Medical Decision Making/ A&P                           Medical Decision Making Patient complains of swelling in bilateral lower legs.  Patient reports she has a family history of heart disease and she became concerned because her legs are swollen  Amount and/or Complexity of Data Reviewed Labs: ordered. Decision-making details documented in ED Course.    Details: Laboratory evaluations ordered reviewed and interpreted.  Urine pregnancy is negative UA shows rare bacteria patient's D-dimer is normal. Radiology: ordered and independent interpretation performed. Decision-making details documented in ED Course.    Details: X-ray shows no acute cardiopulmonary disease ECG/medicine tests: ordered and independent interpretation performed. Decision-making details documented in ED Course.    Details: EKG shows no acute changes  Risk Risk Details: Patient is counseled on findings I have advised her to elevate her legs over the next 24 hours.  She should call her primary care physician to schedule to be seen.  I doubt swelling is from cardiopulmonary disease liver or renal disease.  Swelling is most likely due to dependent edema.  Patient is advised compression socks stockings may be helpful.           Final Clinical Impression(s) / ED Diagnoses Final diagnoses:  Dependent edema    Rx / DC Orders ED Discharge Orders     None      An After Visit Summary was printed and given to the patient.    Elson Areas, Cordelia Poche 03/18/22 2226    Vanetta Mulders, MD 03/19/22 [phone]

## 2022-03-20 ENCOUNTER — Telehealth: Payer: Self-pay | Admitting: Obstetrics and Gynecology

## 2022-03-20 NOTE — Patient Outreach (Signed)
Transition Care Management Follow-up Telephone Call Date of discharge and from where: 03/18/22 from [facility] How have you been since you were released from the hospital? same Any questions or concerns? No  Items Reviewed: Did the pt receive and understand the discharge instructions provided? Yes  Medications obtained and verified?  N/A Other? No  Any new allergies since your discharge? No  Dietary orders reviewed? Yes Do you have support at home? Yes   Home Care and Equipment/Supplies: Were home health services ordered? no If so, what is the name of the agency? N/A  Has the agency set up a time to come to the patient's home? not applicable Were any new equipment or medical supplies ordered?  No What is the name of the medical supply agency? N/A Were you able to get the supplies/equipment? Patient purchased compression hose/ stockings Do you have any questions related to the use of the equipment or supplies? No  Functional Questionnaire: (I = Independent and D = Dependent) ADLs: I  Bathing/Dressing- I  Meal Prep- I  Eating- I  Maintaining continence- I  Transferring/Ambulation- I  Managing Meds- I  Follow up appointments reviewed:  PCP Hospital f/u appt confirmed? Yes  Scheduled to see [facility] on 04/05/22  Specialist Hospital f/u appt confirmed?  N/A   Are transportation arrangements needed? No  If their condition worsens, is the pt aware to call PCP or go to the Emergency Dept.? Yes Was the patient provided with contact information for the PCP's office or ED? Yes Was to pt encouraged to call back with questions or concerns? Yes

## 2022-04-05 DIAGNOSIS — Z6841 Body Mass Index (BMI) 40.0 and over, adult: Secondary | ICD-10-CM | POA: Diagnosis not present

## 2022-04-05 DIAGNOSIS — E662 Morbid (severe) obesity with alveolar hypoventilation: Secondary | ICD-10-CM | POA: Diagnosis not present

## 2022-04-05 DIAGNOSIS — F064 Anxiety disorder due to known physiological condition: Secondary | ICD-10-CM | POA: Diagnosis not present

## 2022-04-05 DIAGNOSIS — F411 Generalized anxiety disorder: Secondary | ICD-10-CM | POA: Diagnosis not present

## 2022-04-12 ENCOUNTER — Ambulatory Visit (INDEPENDENT_AMBULATORY_CARE_PROVIDER_SITE_OTHER): Payer: Medicaid Other | Admitting: Obstetrics & Gynecology

## 2022-04-12 ENCOUNTER — Encounter: Payer: Self-pay | Admitting: Obstetrics & Gynecology

## 2022-04-12 ENCOUNTER — Other Ambulatory Visit (HOSPITAL_COMMUNITY)
Admission: RE | Admit: 2022-04-12 | Discharge: 2022-04-12 | Disposition: A | Discharge status: Home / Self Care | Payer: Medicaid Other | Source: Ambulatory Visit | Attending: Obstetrics & Gynecology | Admitting: Obstetrics & Gynecology

## 2022-04-12 VITALS — BP 122/76 | HR 67 | Ht 66.0 in | Wt 298.2 lb

## 2022-04-12 DIAGNOSIS — Z01419 Encounter for gynecological examination (general) (routine) without abnormal findings: Secondary | ICD-10-CM | POA: Diagnosis not present

## 2022-04-12 DIAGNOSIS — Z0000 Encounter for general adult medical examination without abnormal findings: Secondary | ICD-10-CM | POA: Diagnosis not present

## 2022-04-12 DIAGNOSIS — Z975 Presence of (intrauterine) contraceptive device: Secondary | ICD-10-CM | POA: Diagnosis not present

## 2022-04-12 NOTE — Progress Notes (Signed)
   WELL-WOMAN EXAMINATION Patient name: Diane Medina MRN [id number]  Date of birth: 1990-12-15 Chief Complaint:   Gynecologic Exam  History of Present Illness:   Diane Medina is a 32 y.o. [phone] female being seen today for a routine well-woman exam.   Today she notes that she typically does not have a period with this form of contraception.  Overall happy with Nexplanon and wishes to continue- placed March 2021.  Of note, she has been struggling with bilateral LE swelling- work up negative, seen by several providers no explanation.  The current method of family planning is Nexplanon.    Last pap due today.  Last mammogram: n/a. Last colonoscopy: n/a     04/12/2022    9:32 AM 01/08/2019   11:09 AM  Depression screen PHQ 2/9  Decreased Interest 0 0  Down, Depressed, Hopeless 0 0  PHQ - 2 Score 0 0  Altered sleeping 0 1  Tired, decreased energy 0 1  Change in appetite 0 0  Feeling bad or failure about yourself  0 0  Trouble concentrating 0 0  Moving slowly or fidgety/restless 0 0  Suicidal thoughts 0 0  PHQ-9 Score 0 2      Review of Systems:   Pertinent items are noted in HPI Denies any headaches, blurred vision, fatigue, shortness of breath, chest pain, abdominal pain, bowel movements, urination, or intercourse unless otherwise stated above.  Pertinent History Reviewed:  Reviewed past medical,surgical, social and family history.  Reviewed problem list, medications and allergies. Physical Assessment:   Vitals:   04/12/22 0934  BP: 122/76  Pulse: 67  Weight: 298 lb 3.2 oz (135.3 kg)  Height: 5\' 6"  (1.676 m)  Body mass index is 48.13 kg/m.        Physical Examination:   General appearance - well appearing, and in no distress  Mental status - alert, oriented to person, place, and time  Psych:  She has a normal mood and affect  Skin - warm and dry, normal color, no suspicious lesions noted  Chest - effort normal, all lung fields clear to auscultation  bilaterally  Heart - normal rate and regular rhythm  Neck:  midline trachea, no thyromegaly or nodules  Breasts - breasts appear normal, no suspicious masses, no skin or nipple changes or  axillary nodes  Abdomen - obese, soft, nontender, nondistended, no masses or organomegaly  Pelvic - VULVA: normal appearing vulva with no masses, tenderness or lesions  VAGINA: normal appearing vagina with normal color and discharge, no lesions  CERVIX: normal appearing cervix without discharge or lesions, no CMT  Thin prep pap is done with HR HPV cotesting  UTERUS: uterus is felt to be normal size, shape, consistency and nontender   ADNEXA: No adnexal masses or tenderness noted- exam limited due to body habitus  Extremities:  2+ bilateral edema, left arm with palpable device  Chaperone:  pt declined      Assessment & Plan:  1) Well-Woman Exam -pap collected, reviewed ASCCP guidelines  2) Contraceptive management -Nexplanon in place, due for replacement prior to March 2025  Meds: No orders of the defined types were placed in this encounter.   Follow-up: Return in about 1 year (around 04/13/2023) for Annual, Nexplanon replacment next available.   Janyth Pupa, DO Attending [place], [facility] for [employer], [place]

## 2022-04-13 ENCOUNTER — Encounter: Payer: Self-pay | Admitting: Adult Health

## 2022-04-13 ENCOUNTER — Ambulatory Visit: Payer: Medicaid Other | Admitting: Adult Health

## 2022-04-13 VITALS — BP 125/79 | HR 78 | Ht 66.0 in | Wt 298.0 lb

## 2022-04-13 DIAGNOSIS — Z3046 Encounter for surveillance of implantable subdermal contraceptive: Secondary | ICD-10-CM | POA: Diagnosis not present

## 2022-04-13 DIAGNOSIS — Z3202 Encounter for pregnancy test, result negative: Secondary | ICD-10-CM | POA: Diagnosis not present

## 2022-04-13 LAB — POCT URINE PREGNANCY: Preg Test, Ur: NEGATIVE

## 2022-04-13 MED ORDER — ETONOGESTREL 68 MG ~~LOC~~ IMPL
68.0000 mg | DRUG_IMPLANT | Freq: Once | SUBCUTANEOUS | Status: AC
  Administered 2022-04-13: 68 mg via SUBCUTANEOUS

## 2022-04-13 NOTE — Addendum Note (Signed)
Addended by: Linton Rump on: 04/13/2022 12:42 PM   Modules accepted: Orders

## 2022-04-13 NOTE — Patient Instructions (Signed)
Use condoms x 2 weeks, keep clean and dry x 24 hours, no heavy lifting, keep steri strips on x 72 hours, Keep pressure dressing on x 24 hours. Follow up prn problems.  

## 2022-04-13 NOTE — Progress Notes (Signed)
  Subjective:     Patient ID: Diane Medina, female   DOB: [birth year], 32 y.o.   MRN: [id number]  HPI Taleah is a 32 year old white female, single, engaged, [id number], in for nexplanon removal and reinsertion. She had pap and physical yesterday.   Review of Systems For nexplanon removal and reinsertion Reviewed past medical,surgical, social and family history. Reviewed medications and allergies.     Objective:   Physical Exam BP 125/79 (BP Location: Left Arm, Patient Position: Sitting, Cuff Size: Normal)   Pulse 78   Ht 5\' 6"  (1.676 m)   Wt 298 lb (135.2 kg)   BMI 48.10 kg/m  UPT is negative. Consent signed, and time out called. Left arm cleansed with betadine, and injected with 1.5 cc 2% lidocaine and waited til numb.Under sterile technique a #11 blade was used to make small vertical incision, and a curved forceps was used to easily remove rod. New rod easily placed, and palpated by provider and pt. Steri strips applied. Pressure dressing applied.       [place] - 04/13/22 1037       Pregnancy Intention Screening   Does the patient want to become pregnant in the next year? No    Does the patient's partner want to become pregnant in the next year? No    Would the patient like to discuss contraceptive options today? No      Contraception Wrap Up   Current Method Hormonal Implant    End Method Hormonal Implant             Assessment:     1. Pregnancy examination or test, negative result  2. Encounter for removal and reinsertion of Nexplanon LOT [id number]   Use condoms x 2 weeks, keep clean and dry x 24 hours, no heavy lifting, keep steri strips on x 72 hours, Keep pressure dressing on x 24 hours. Follow up prn problems.     Plan:     Remove nexplanon in 3 years or sooner if desired

## 2022-04-16 LAB — CYTOLOGY - PAP
Chlamydia: NEGATIVE
Comment: NEGATIVE
Comment: NEGATIVE
Comment: NORMAL
Diagnosis: UNDETERMINED — AB
High risk HPV: NEGATIVE
Neisseria Gonorrhea: NEGATIVE

## 2022-07-16 ENCOUNTER — Emergency Department (HOSPITAL_COMMUNITY)
Admission: EM | Admit: 2022-07-16 | Discharge: 2022-07-16 | Disposition: A | Discharge status: Home / Self Care | Payer: Medicaid Other | Attending: Emergency Medicine | Admitting: Emergency Medicine

## 2022-07-16 ENCOUNTER — Encounter (HOSPITAL_COMMUNITY): Payer: Self-pay | Admitting: *Deleted

## 2022-07-16 ENCOUNTER — Other Ambulatory Visit: Payer: Self-pay

## 2022-07-16 DIAGNOSIS — K029 Dental caries, unspecified: Secondary | ICD-10-CM | POA: Diagnosis not present

## 2022-07-16 DIAGNOSIS — R519 Headache, unspecified: Secondary | ICD-10-CM | POA: Insufficient documentation

## 2022-07-16 DIAGNOSIS — K0889 Other specified disorders of teeth and supporting structures: Secondary | ICD-10-CM | POA: Diagnosis present

## 2022-07-16 MED ORDER — IBUPROFEN 800 MG PO TABS: 800.0000 mg | ORAL_TABLET | Freq: Three times a day (TID) | ORAL | 0 refills | Status: AC

## 2022-07-16 MED ORDER — CLINDAMYCIN PHOSPHATE 600 MG/50ML IV SOLN
600.0000 mg | Freq: Once | INTRAVENOUS | Status: AC
  Administered 2022-07-16: 600 mg via INTRAVENOUS
  Filled 2022-07-16: qty 50

## 2022-07-16 MED ORDER — CLINDAMYCIN HCL 300 MG PO CAPS: 300.0000 mg | ORAL_CAPSULE | Freq: Three times a day (TID) | ORAL | 0 refills | Status: AC

## 2022-07-16 NOTE — ED Notes (Signed)
Dc instructions and scripts reviewed with pt no questions or concerns at this time. Will follow up with dentist.  

## 2022-07-16 NOTE — ED Provider Notes (Signed)
[place] EMERGENCY DEPARTMENT AT [facility] Provider Note   CSN: [id number] Arrival date & time: 07/16/22  1519     History  Chief Complaint  Patient presents with   Dental Pain    Diane Medina is a 32 y.o. female.   Dental Pain Associated symptoms: facial swelling and headaches   Associated symptoms: no fever        Diane Medina is a 32 y.o. female who presents to the Emergency Department complaining of left facial pain, dental pain, headache.  States that she had a left lower tooth decayed to the gumline 4 days ago.  She has had pain of the left jaw since then.  Pain worsens with sensation of hot or cold foods or liquids.  Pain radiates from her left lower jaw area to her ear and behind her left eye.  Has been unable to see her dentist has an appointment in 3 weeks.  She denies difficulty swallowing, fever, chills, neck pain or difficulty with opening and closing her mouth.   Home Medications Prior to Admission medications   Medication Sig Start Date End Date Taking? Authorizing Provider  etonogestrel (NEXPLANON) 68 MG IMPL implant 1 each by Subdermal route once.    [provider]      Allergies    Penicillins    Review of Systems   Review of Systems  Constitutional:  Negative for appetite change, chills and fever.  HENT:  Positive for dental problem and facial swelling. Negative for sore throat and trouble swallowing.   Eyes:  Negative for visual disturbance.  Respiratory:  Negative for cough and shortness of breath.   Cardiovascular:  Negative for chest pain.  Gastrointestinal:  Negative for abdominal pain, nausea and vomiting.  Musculoskeletal:  Negative for arthralgias.  Skin:  Negative for rash.  Neurological:  Positive for headaches. Negative for dizziness, syncope, facial asymmetry, weakness and numbness.    Physical Exam Updated Vital Signs BP 126/81 (BP Location: Right Arm)   Pulse 66   Temp 97.7 F (36.5 C) (Oral)   Resp  18   Ht  (1.676 m)   Wt 122.5 kg   SpO2 97%   BMI 43.58 kg/m  Physical Exam Vitals and nursing note reviewed.  Constitutional:      General: She is not in acute distress.    Appearance: Normal appearance. She is not toxic-appearing.  HENT:     Right Ear: Tympanic membrane and ear canal normal.     Left Ear: Tympanic membrane and ear canal normal.     Mouth/Throat:     Mouth: Mucous membranes are moist.     Dentition: Dental tenderness, gingival swelling and dental caries present. No dental abscesses.     Pharynx: Oropharynx is clear. Uvula midline. No oropharyngeal exudate, posterior oropharyngeal erythema or uvula swelling.     Comments: Tender to palpation along the left lower gumline.  Several dental caries noted.  there is some erythema and mild edema of the gingiva.  I do not appreciate any facial edema.  Uvula midline, nonedematous no trismus Eyes:     Extraocular Movements: Extraocular movements intact.     Conjunctiva/sclera: Conjunctivae normal.     Pupils: Pupils are equal, round, and reactive to light.  Cardiovascular:     Rate and Rhythm: Normal rate and regular rhythm.     Pulses: Normal pulses.  Pulmonary:     Effort: Pulmonary effort is normal.  Chest:     Chest wall: No  tenderness.  Abdominal:     Palpations: Abdomen is soft.     Tenderness: There is no abdominal tenderness.  Skin:    General: Skin is warm.     Capillary Refill: Capillary refill takes less than 2 seconds.  Neurological:     General: No focal deficit present.     Mental Status: She is alert.     Sensory: No sensory deficit.     Motor: No weakness.     ED Results / Procedures / Treatments   Labs (all labs ordered are listed, but only abnormal results are displayed) Labs Reviewed - No data to display  EKG None  Radiology No results found.  Procedures Procedures    Medications Ordered in ED Medications  clindamycin (CLEOCIN) IVPB 600 mg (600 mg Intravenous New Bag/Given  07/16/22 1705)    ED Course/ Medical Decision Making/ A&P                             Medical Decision Making Patient here for evaluation of facial pain, headache, dental pain and ear pain.  Notes dental caries with decay of the tooth 4 days ago.  Has been unable to see her dentist.  Pain worsens with exposure to hot or cold foods or liquids.  She denies fever, neck pain, difficulty opening and closing her mouth or difficulty swallowing.   Differential would include but not limited to dental decay, developing abscess, Ludewig's angina.  Patient is without trismus or sublingual abnormalities to suggest Ludewig's angina.  On my exam, I do not appreciate any drainable abscess.   Amount and/or Complexity of Data Reviewed Discussion of management or test interpretation with external provider(s): Patient received IV antibiotics here, vital signs reassuring.  I feel that she is appropriate to discharge home will provide resource list for local dentistry.  Risk Prescription drug management.           Final Clinical Impression(s) / ED Diagnoses Final diagnoses:  Pain, dental    Rx / DC Orders ED Discharge Orders     None         Pauline Aus, PA-C 07/16/22 1757    Terrilee Files, MD 07/17/22 1011

## 2022-07-16 NOTE — ED Triage Notes (Signed)
Pt with broken tooth to left lower about 4 days ago. C/o pain to left lower jaw.  Unable to see her dentist, appt made 3 weeks out. Pain behind her left eye and left ear as well.

## 2022-07-16 NOTE — Discharge Instructions (Signed)
Your facial pain is likely coming from your teeth.  I recommend warm salt water rinses several times a day.  Take the antibiotic as directed.  You may start the antibiotic tomorrow morning.  Please contact one of the dentist on the list provided to arrange a follow-up appointment.

## 2023-06-14 ENCOUNTER — Emergency Department (HOSPITAL_COMMUNITY)
Admission: EM | Admit: 2023-06-14 | Discharge: 2023-06-14 | Disposition: A | Discharge status: Home / Self Care | Attending: Emergency Medicine | Admitting: Emergency Medicine

## 2023-06-14 ENCOUNTER — Emergency Department (HOSPITAL_COMMUNITY)

## 2023-06-14 ENCOUNTER — Encounter (HOSPITAL_COMMUNITY): Payer: Self-pay

## 2023-06-14 ENCOUNTER — Other Ambulatory Visit: Payer: Self-pay

## 2023-06-14 DIAGNOSIS — S46912A Strain of unspecified muscle, fascia and tendon at shoulder and upper arm level, left arm, initial encounter: Secondary | ICD-10-CM | POA: Diagnosis not present

## 2023-06-14 DIAGNOSIS — M25512 Pain in left shoulder: Secondary | ICD-10-CM | POA: Diagnosis present

## 2023-06-14 DIAGNOSIS — W010XXA Fall on same level from slipping, tripping and stumbling without subsequent striking against object, initial encounter: Secondary | ICD-10-CM | POA: Insufficient documentation

## 2023-06-14 MED ORDER — NAPROXEN 500 MG PO TABS: 500.0000 mg | ORAL_TABLET | Freq: Two times a day (BID) | ORAL | 0 refills | Status: AC

## 2023-06-14 MED ORDER — NAPROXEN 250 MG PO TABS
500.0000 mg | ORAL_TABLET | Freq: Once | ORAL | Status: AC
  Administered 2023-06-14: 500 mg via ORAL
  Filled 2023-06-14: qty 2

## 2023-06-14 NOTE — ED Triage Notes (Signed)
 Pt stated that she fell over an extension cord 3 weeks ago and landed on her left shoulder the wrong way. Pt stated that she has been having bad pain in her shoulder blade as well as limited ROM.

## 2023-06-14 NOTE — ED Provider Notes (Signed)
 [place] EMERGENCY DEPARTMENT AT [facility] Provider Note   CSN: [id number] Arrival date & time: 06/14/23  1817     History  Chief Complaint  Patient presents with   Shoulder Pain    Diane Medina is a 33 y.o. female.   Shoulder Pain  This patient is a 33 year old female who presents within the last couple of weeks after having a fall onto her left shoulder.  She reports that she tried to brace herself with her hands after she tripped over an electrical cord but unfortunately strained her left shoulder.  She has been having pain with specifically internal rotation of the shoulder as well as abduction, she has no numbness or weakness of the arm, tenderness is around the shoulder girdle and posterior.  No head injury, no numbness or weakness    Home Medications Prior to Admission medications   Medication Sig Start Date End Date Taking? Authorizing Provider  naproxen (NAPROSYN) 500 MG tablet Take 1 tablet (500 mg total) by mouth 2 (two) times daily. 06/14/23  Yes Eber Hong, MD  clindamycin (CLEOCIN) 300 MG capsule Take 1 capsule (300 mg total) by mouth 3 (three) times daily. 07/16/22   Triplett, Tammy, PA-C  etonogestrel (NEXPLANON) 68 MG IMPL implant 1 each by Subdermal route once.    [provider]  ibuprofen (ADVIL) 800 MG tablet Take 1 tablet (800 mg total) by mouth 3 (three) times daily. Take with food 07/16/22   Triplett, Tammy, PA-C      Allergies    Penicillins    Review of Systems   Review of Systems  All other systems reviewed and are negative.   Physical Exam Updated Vital Signs BP (!) 145/79   Pulse 78   Temp 98.6 F (37 C) (Oral)   Ht 1.651 m (5\' 5" )   Wt 127 kg   SpO2 100%   BMI 46.59 kg/m  Physical Exam Vitals and nursing note reviewed.  Constitutional:      General: She is not in acute distress.    Appearance: She is well-developed.  HENT:     Head: Normocephalic and atraumatic.     Mouth/Throat:     Pharynx: No  oropharyngeal exudate.  Eyes:     General: No scleral icterus.       Right eye: No discharge.        Left eye: No discharge.     Conjunctiva/sclera: Conjunctivae normal.     Pupils: Pupils are equal, round, and reactive to light.  Neck:     Thyroid: No thyromegaly.     Vascular: No JVD.  Cardiovascular:     Rate and Rhythm: Normal rate and regular rhythm.     Heart sounds: Normal heart sounds. No murmur heard.    No friction rub. No gallop.  Pulmonary:     Effort: Pulmonary effort is normal. No respiratory distress.     Breath sounds: Normal breath sounds. No wheezing or rales.  Abdominal:     General: Bowel sounds are normal. There is no distension.     Palpations: Abdomen is soft. There is no mass.     Tenderness: There is no abdominal tenderness.  Musculoskeletal:        General: Tenderness present.     Cervical back: Normal range of motion and neck supple.     Comments: The shoulder itself is supple, there is mild tenderness over the lateral deltoid and the posterior shoulder, there is no bony tenderness over the clavicle  the humerus or the scapula  Lymphadenopathy:     Cervical: No cervical adenopathy.  Skin:    General: Skin is warm and dry.     Findings: No erythema or rash.  Neurological:     Mental Status: She is alert.     Coordination: Coordination normal.     Comments: No numbness or weakness of the left upper extremity and normal pulses at the left radial artery  Psychiatric:        Behavior: Behavior normal.     ED Results / Procedures / Treatments   Labs (all labs ordered are listed, but only abnormal results are displayed) Labs Reviewed - No data to display  EKG None  Radiology No results found.  Procedures Procedures    Medications Ordered in ED Medications  naproxen (NAPROSYN) tablet 500 mg (has no administration in time range)    ED Course/ Medical Decision Making/ A&P                                 Medical Decision Making Amount  and/or Complexity of Data Reviewed Radiology: ordered.  Risk Prescription drug management.   I have personally viewed and interpreted the x-rays of the shoulder, there is no signs of fractures, no AC separation, humerus is well-seated in the glenoid fossa, scapula appears okay, no obvious fractures  Exam consistent with possible rotator cuff strain or shoulder strain.  NSAIDs, sling, home  Nurse to place sling, neurovascularly intact  Patient agreeable to follow-up outpatient with orthopedics.        Final Clinical Impression(s) / ED Diagnoses Final diagnoses:  Shoulder strain, left, initial encounter    Rx / DC Orders ED Discharge Orders          Ordered    naproxen (NAPROSYN) 500 MG tablet  2 times daily        06/14/23 1953              Eber Hong, MD 06/14/23 1955

## 2023-06-14 NOTE — Discharge Instructions (Signed)
 Please take Naprosyn, 500mg  by mouth twice daily as needed for pain - this in an antiinflammatory medicine (NSAID) and is similar to ibuprofen - many people feel that it is stronger than ibuprofen and it is easier to take since it is a smaller pill.  Please use this only for 1 week - if your pain persists, you will need to follow up with your doctor in the office for ongoing guidance and pain control.  Thank you for allowing [place] to treat you in the emergency department today.  After reviewing your examination and potential testing that was done it appears that you are safe to go home.  I would like for you to follow-up with your doctor within the next several days, have them obtain your records and follow-up with them to review all potential tests and results from your visit.  If you should develop severe or worsening symptoms return to the emergency department immediately  Call the office of the orthopedist to be seen within the next 2 weeks

## 2023-06-18 ENCOUNTER — Emergency Department (HOSPITAL_COMMUNITY)
Admission: EM | Admit: 2023-06-18 | Discharge: 2023-06-19 | Disposition: A | Discharge status: Home / Self Care | Attending: Emergency Medicine | Admitting: Emergency Medicine

## 2023-06-18 ENCOUNTER — Other Ambulatory Visit: Payer: Self-pay

## 2023-06-18 ENCOUNTER — Emergency Department (HOSPITAL_COMMUNITY)

## 2023-06-18 ENCOUNTER — Ambulatory Visit: Admitting: Orthopedic Surgery

## 2023-06-18 ENCOUNTER — Encounter (HOSPITAL_COMMUNITY): Payer: Self-pay | Admitting: Emergency Medicine

## 2023-06-18 ENCOUNTER — Encounter: Payer: Self-pay | Admitting: Orthopedic Surgery

## 2023-06-18 VITALS — BP 133/81 | HR 73 | Ht 65.0 in | Wt 305.0 lb

## 2023-06-18 DIAGNOSIS — M25512 Pain in left shoulder: Secondary | ICD-10-CM | POA: Diagnosis not present

## 2023-06-18 DIAGNOSIS — W010XXA Fall on same level from slipping, tripping and stumbling without subsequent striking against object, initial encounter: Secondary | ICD-10-CM | POA: Diagnosis not present

## 2023-06-18 DIAGNOSIS — R064 Hyperventilation: Secondary | ICD-10-CM | POA: Diagnosis not present

## 2023-06-18 DIAGNOSIS — R0789 Other chest pain: Secondary | ICD-10-CM | POA: Diagnosis not present

## 2023-06-18 DIAGNOSIS — R0602 Shortness of breath: Secondary | ICD-10-CM | POA: Insufficient documentation

## 2023-06-18 LAB — CBC
HCT: 40.2 % (ref 36.0–46.0)
Hemoglobin: 13.6 g/dL (ref 12.0–15.0)
MCH: 26 pg (ref 26.0–34.0)
MCHC: 33.8 g/dL (ref 30.0–36.0)
MCV: 76.9 fL — ABNORMAL LOW (ref 80.0–100.0)
Platelets: 244 10*3/uL (ref 150–400)
RBC: 5.23 MIL/uL — ABNORMAL HIGH (ref 3.87–5.11)
RDW: 14.2 % (ref 11.5–15.5)
WBC: 9.6 10*3/uL (ref 4.0–10.5)
nRBC: 0 % (ref 0.0–0.2)

## 2023-06-18 MED ORDER — PREDNISONE 10 MG (21) PO TBPK: ORAL_TABLET | ORAL | 0 refills | Status: AC

## 2023-06-18 MED ORDER — CYCLOBENZAPRINE HCL 10 MG PO TABS: 10.0000 mg | ORAL_TABLET | Freq: Two times a day (BID) | ORAL | 0 refills | Status: DC | PRN

## 2023-06-18 MED ORDER — LORAZEPAM 2 MG/ML IJ SOLN
2.0000 mg | Freq: Once | INTRAMUSCULAR | Status: AC
  Administered 2023-06-18: 2 mg via INTRAVENOUS
  Filled 2023-06-18: qty 1
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
## 2023-06-18 NOTE — Addendum Note (Signed)
 Addended by: Baird Kay on: 06/18/2023 02:01 PM   Modules accepted: Orders

## 2023-06-18 NOTE — ED Triage Notes (Signed)
 Pt to ED from home, brought in by boyfriend, pt screaming in lobby, "I can't breathe" banging on ED doors in lobby, pt hyperventilating, c/o chest pressure and shoulder pain, boyfriend says pt took prednisone that was prescribed from urgent care, shortly after started "acting this way"

## 2023-06-18 NOTE — ED Triage Notes (Signed)
 Pt present c/o shortness of breath, chest pressure, hyperventilating after taking flexeril and prednisone earlier today, EDP at bedside

## 2023-06-19 LAB — COMPREHENSIVE METABOLIC PANEL
ALT: 25 U/L (ref 0–44)
AST: 28 U/L (ref 15–41)
Albumin: 3.8 g/dL (ref 3.5–5.0)
Alkaline Phosphatase: 79 U/L (ref 38–126)
Anion gap: 12 (ref 5–15)
BUN: 20 mg/dL (ref 6–20)
CO2: 18 mmol/L — ABNORMAL LOW (ref 22–32)
Calcium: 9.2 mg/dL (ref 8.9–10.3)
Chloride: 107 mmol/L (ref 98–111)
Creatinine, Ser: 1.18 mg/dL — ABNORMAL HIGH (ref 0.44–1.00)
GFR, Estimated: >60 mL/min (ref >60)
Glucose, Bld: 135 mg/dL — ABNORMAL HIGH (ref 70–99)
Potassium: 4.1 mmol/L (ref 3.5–5.1)
Sodium: 137 mmol/L (ref 135–145)
Total Bilirubin: 1.3 mg/dL — ABNORMAL HIGH (ref 0.0–1.2)
Total Protein: 7.8 g/dL (ref 6.5–8.1)

## 2023-06-19 MED ORDER — METHOCARBAMOL 500 MG PO TABS: 500.0000 mg | ORAL_TABLET | Freq: Three times a day (TID) | ORAL | 0 refills | Status: AC | PRN

## 2023-06-19 NOTE — ED Provider Notes (Signed)
 [facility] Emergency Department Provider Note MRN:  [id number]  Arrival date & time: 06/19/23     Chief Complaint   Shortness of breath History of Present Illness   Diane Medina is a 33 y.o. year-old female with no pertinent past medical history presenting to the ED with chief complaint of shortness of breath.  Patient in distress on arrival in the emergency department waiting room exhibiting severe shortness of breath, complaining of chest pressure.  Hyperventilating.  Review of Systems  A thorough review of systems was obtained and all systems are negative except as noted in the HPI and PMH.   Patient's Health History    Past Medical History:  Diagnosis Date   Gestational diabetes    during pregnancy    Past Surgical History:  Procedure Laterality Date   NO PAST SURGERIES      Family History  Problem Relation Age of Onset   Diabetes Paternal Grandmother    Cancer Maternal Grandfather    Dilated cardiomyopathy Father    ADD / ADHD Brother    Autism Brother    Autism Daughter    Multiple sclerosis Paternal Aunt     Social History   Socioeconomic History   Marital status: Single    Spouse name: Not on file   Number of children: 4   Years of education: Not on file   Highest education level: Associate degree: occupational, Scientist, product/process development, or vocational program  Occupational History   Not on file  Tobacco Use   Smoking status: Never   Smokeless tobacco: Never  Vaping Use   Vaping status: Never Used  Substance and Sexual Activity   Alcohol use: No   Drug use: No   Sexual activity: Yes    Birth control/protection: Implant  Other Topics Concern   Not on file  Social History Narrative   Not on file   Social Drivers of Health   Financial Resource Strain: Low Risk  (04/12/2022)   Overall Financial Resource Strain (CARDIA)    Difficulty of Paying Living Expenses: Not very hard  Food Insecurity: No Food Insecurity (04/12/2022)   Hunger  Vital Sign    Worried About Running Out of Food in the Last Year: Never true    Ran Out of Food in the Last Year: Never true  Transportation Needs: Unmet Transportation Needs (04/12/2022)   PRAPARE - Administrator, Civil Service (Medical): Yes    Lack of Transportation (Non-Medical): No  Physical Activity: Sufficiently Active (04/12/2022)   Exercise Vital Sign    Days of Exercise per Week: 5 days    Minutes of Exercise per Session: 30 min  Stress: Stress Concern Present (04/12/2022)   [employer] of Occupational Health - Occupational Stress Questionnaire    Feeling of Stress : Rather much  Social Connections: Moderately Isolated (04/12/2022)   Social Connection and Isolation Panel [NHANES]    Frequency of Communication with Friends and Family: Twice a week    Frequency of Social Gatherings with Friends and Family: Never    Attends Religious Services: 1 to 4 times per year    Active Member of [employer] or Organizations: No    Attends Banker Meetings: Never    Marital Status: Living with partner  Intimate Partner Violence: Not At Risk (04/12/2022)   Humiliation, Afraid, Rape, and Kick questionnaire    Fear of Current or Ex-Partner: No    Emotionally Abused: No    Physically Abused: No    Sexually  Abused: No     Physical Exam   Vitals:   06/18/23 2345 06/18/23 2348  BP: 119/74   Pulse: 86   Resp: 16   Temp:  97.8 F (36.6 C)  SpO2: 96% 98%    CONSTITUTIONAL: Well-appearing, respiratory distress NEURO/PSYCH:  Alert and oriented x 3, no focal deficits EYES:  eyes equal and reactive ENT/NECK:  no LAD, no JVD CARDIO: Regular rate, well-perfused, normal S1 and S2 PULM:  CTAB no wheezing or rhonchi GI/GU:  non-distended, non-tender MSK/SPINE:  No gross deformities, no edema SKIN:  no rash, atraumatic   *Additional and/or pertinent findings included in MDM below  Diagnostic and Interventional Summary    EKG Interpretation Date/Time:  Tuesday June 18 2023 23:51:18 EDT Ventricular Rate:  93 PR Interval:  151 QRS Duration:  100 QT Interval:  356 QTC Calculation: 443 R Axis:   -30  Text Interpretation: Sinus rhythm S1,S2,S3 pattern Low voltage, precordial leads Confirmed by Kennis Carina [phone]) on 06/19/2023 12:24:39 AM       Labs Reviewed  CBC - Abnormal; Notable for the following components:      Result Value   RBC 5.23 (*)    MCV 76.9 (*)    All other components within normal limits  COMPREHENSIVE METABOLIC PANEL - Abnormal; Notable for the following components:   CO2 18 (*)    Glucose, Bld 135 (*)    Creatinine, Ser 1.18 (*)    Total Bilirubin 1.3 (*)    All other components within normal limits    DG Chest Port 1 View  Final Result      Medications  LORazepam (ATIVAN) injection 2 mg (2 mg Intravenous Given 06/18/23 2342)     Procedures  /  Critical Care Procedures  ED Course and Medical Decision Making  Initial Impression and Ddx Patient hyperventilating but without airway concerns, vital signs normal, initial suspicion is for panic attack just by clinical gestalt however must also consider pneumothorax, ACS, PE, allergic reaction.  No wheezing on exam, lungs are clear.  Providing some Ativan and will reassess.  Past medical/surgical history that increases complexity of ED encounter: None  Interpretation of Diagnostics I personally reviewed the Chest Xray and my interpretation is as follows: No pneumothorax  No significant blood count or electrolyte disturbance.  Patient Reassessment and Ultimate Disposition/Management     Patient with resolution of all symptoms after Ativan, feels a lot better.  Explains that she started feeling weird after the Flexeril.  Does endorse a prior history of panic attacks.  Doubt emergent process, appropriate for discharge.  Patient management required discussion with the following services or consulting groups:  None  Complexity of Problems Addressed Acute illness or  injury that poses threat of life of bodily function  Additional Data Reviewed and Analyzed Further history obtained from: Further history from spouse/family member  Additional Factors Impacting ED Encounter Risk Prescriptions  Elmer Sow. Pilar Plate, MD [facility] Emergency Medicine [facility] [email or web address]  Final Clinical Impressions(s) / ED Diagnoses     ICD-10-CM   1. Shortness of breath  R06.02       ED Discharge Orders          Ordered    methocarbamol (ROBAXIN) 500 MG tablet  Every 8 hours PRN        06/19/23 0115             Discharge Instructions Discussed with and Provided to Patient:     Discharge Instructions  You were evaluated in the Emergency Department and after careful evaluation, we did not find any emergent condition requiring admission or further testing in the hospital.  Your exam/testing today is overall reassuring.  Symptoms may have been due to side effect of the new medication or related to panic attack.  Recommend stopping the Flexeril, can try the Robaxin muscle relaxer instead, continue your follow-up with your regular doctors.  Please return to the Emergency Department if you experience any worsening of your condition.   Thank you for allowing [place] to be a part of your care.       Sabas Sous, MD 06/19/23 [phone]

## 2023-06-19 NOTE — Discharge Instructions (Signed)
 You were evaluated in the Emergency Department and after careful evaluation, we did not find any emergent condition requiring admission or further testing in the hospital.  Your exam/testing today is overall reassuring.  Symptoms may have been due to side effect of the new medication or related to panic attack.  Recommend stopping the Flexeril, can try the Robaxin muscle relaxer instead, continue your follow-up with your regular doctors.  Please return to the Emergency Department if you experience any worsening of your condition.   Thank you for allowing [place] to be a part of your care.

## 2023-07-12 ENCOUNTER — Ambulatory Visit: Admitting: Orthopedic Surgery

## 2023-08-19 ENCOUNTER — Emergency Department (HOSPITAL_COMMUNITY)
Admission: EM | Admit: 2023-08-19 | Discharge: 2023-08-19 | Disposition: A | Discharge status: Home / Self Care | Attending: Emergency Medicine | Admitting: Emergency Medicine

## 2023-08-19 ENCOUNTER — Emergency Department (HOSPITAL_COMMUNITY)

## 2023-08-19 ENCOUNTER — Encounter (HOSPITAL_COMMUNITY): Payer: Self-pay | Admitting: *Deleted

## 2023-08-19 ENCOUNTER — Other Ambulatory Visit: Payer: Self-pay

## 2023-08-19 DIAGNOSIS — N10 Acute tubulo-interstitial nephritis: Secondary | ICD-10-CM | POA: Diagnosis not present

## 2023-08-19 DIAGNOSIS — R109 Unspecified abdominal pain: Secondary | ICD-10-CM | POA: Diagnosis present

## 2023-08-19 LAB — URINALYSIS, ROUTINE W REFLEX MICROSCOPIC
Bilirubin Urine: NEGATIVE
Glucose, UA: NEGATIVE mg/dL
Ketones, ur: NEGATIVE mg/dL
Nitrite: NEGATIVE
Protein, ur: NEGATIVE mg/dL
RBC / HPF: >50 RBC/hpf (ref 0–5)
Specific Gravity, Urine: 1.02 (ref 1.005–1.030)
pH: 6 (ref 5.0–8.0)

## 2023-08-19 LAB — CBC
HCT: 39.9 % (ref 36.0–46.0)
Hemoglobin: 13.2 g/dL (ref 12.0–15.0)
MCH: 26.5 pg (ref 26.0–34.0)
MCHC: 33.1 g/dL (ref 30.0–36.0)
MCV: 80.1 fL (ref 80.0–100.0)
Platelets: 181 10*3/uL (ref 150–400)
RBC: 4.98 MIL/uL (ref 3.87–5.11)
RDW: 13.5 % (ref 11.5–15.5)
WBC: 5.7 10*3/uL (ref 4.0–10.5)
nRBC: 0 % (ref 0.0–0.2)

## 2023-08-19 LAB — COMPREHENSIVE METABOLIC PANEL WITH GFR
ALT: 15 U/L (ref 0–44)
AST: 15 U/L (ref 15–41)
Albumin: 3.4 g/dL — ABNORMAL LOW (ref 3.5–5.0)
Alkaline Phosphatase: 71 U/L (ref 38–126)
Anion gap: 5 (ref 5–15)
BUN: 14 mg/dL (ref 6–20)
CO2: 26 mmol/L (ref 22–32)
Calcium: 9 mg/dL (ref 8.9–10.3)
Chloride: 104 mmol/L (ref 98–111)
Creatinine, Ser: 0.73 mg/dL (ref 0.44–1.00)
GFR, Estimated: >60 mL/min (ref >60)
Glucose, Bld: 100 mg/dL — ABNORMAL HIGH (ref 70–99)
Potassium: 4.3 mmol/L (ref 3.5–5.1)
Sodium: 135 mmol/L (ref 135–145)
Total Bilirubin: 0.9 mg/dL (ref 0.0–1.2)
Total Protein: 6.8 g/dL (ref 6.5–8.1)

## 2023-08-19 LAB — PREGNANCY, URINE: Preg Test, Ur: NEGATIVE

## 2023-08-19 MED ORDER — SULFAMETHOXAZOLE-TRIMETHOPRIM 800-160 MG PO TABS: 1.0000 | ORAL_TABLET | Freq: Two times a day (BID) | ORAL | 0 refills | Status: AC

## 2023-08-19 NOTE — ED Triage Notes (Signed)
 Pt with right lower back pain that radiates around to abd. Ongoing x 3 days. Pain with urination.  + chills

## 2023-08-19 NOTE — ED Provider Notes (Signed)
 [place] EMERGENCY DEPARTMENT AT [facility] Provider Note   CSN: [id number] Arrival date & time: 08/19/23  1128     History  Chief Complaint  Patient presents with   Back Pain    Diane Medina is a 33 y.o. female.  Patient with noncontributory past medical history presents today with complaints of flank pain.  She states that same began 3 days ago and has been persistent since then.  She notes associated dysuria.  She has had UTIs previously and states this feels more severe.  She does also note history of kidney stones.  She denies fevers or chills.  Denies nausea, vomiting, or diarrhea.  Denies any vaginal discharge or concern for STDs.  The history is provided by the patient. No language interpreter was used.  Back Pain      Home Medications Prior to Admission medications   Medication Sig Start Date End Date Taking? Authorizing Provider  clindamycin  (CLEOCIN ) 300 MG capsule Take 1 capsule (300 mg total) by mouth 3 (three) times daily. 07/16/22   Triplett, Tammy, PA-C  etonogestrel  (NEXPLANON ) 68 MG IMPL implant 1 each by Subdermal route once.    [provider]  ibuprofen  (ADVIL ) 800 MG tablet Take 1 tablet (800 mg total) by mouth 3 (three) times daily. Take with food 07/16/22   Triplett, Tammy, PA-C  methocarbamol  (ROBAXIN ) 500 MG tablet Take 1 tablet (500 mg total) by mouth every 8 (eight) hours as needed for muscle spasms. 06/19/23   Edson Graces, MD  naproxen  (NAPROSYN ) 500 MG tablet Take 1 tablet (500 mg total) by mouth 2 (two) times daily. 06/14/23   Early Glisson, MD  predniSONE  (STERAPRED UNI-PAK 21 TAB) 10 MG (21) TBPK tablet 10 mg DS 12 as directed 06/18/23   Tonita Frater, MD      Allergies    Penicillins    Review of Systems   Review of Systems  Genitourinary:  Positive for flank pain.  All other systems reviewed and are negative.   Physical Exam Updated Vital Signs BP 121/78   Pulse 80   Temp (!) 97.5 F (36.4 C)   Resp (!) 22    Ht 5\' 5"  (1.651 m)   Wt (!) 136.5 kg   SpO2 98%   BMI 50.09 kg/m  Physical Exam Vitals and nursing note reviewed.  Constitutional:      General: She is not in acute distress.    Appearance: Normal appearance. She is normal weight. She is not ill-appearing, toxic-appearing or diaphoretic.  HENT:     Head: Normocephalic and atraumatic.  Cardiovascular:     Rate and Rhythm: Normal rate.  Pulmonary:     Effort: Pulmonary effort is normal. No respiratory distress.  Abdominal:     General: Abdomen is flat.     Palpations: Abdomen is soft.     Tenderness: There is right CVA tenderness.  Musculoskeletal:        General: Normal range of motion.     Cervical back: Normal range of motion.  Skin:    General: Skin is warm and dry.  Neurological:     General: No focal deficit present.     Mental Status: She is alert.  Psychiatric:        Mood and Affect: Mood normal.        Behavior: Behavior normal.     ED Results / Procedures / Treatments   Labs (all labs ordered are listed, but only abnormal results are displayed) Labs  Reviewed  URINALYSIS, ROUTINE W REFLEX MICROSCOPIC - Abnormal; Notable for the following components:      Result Value   APPearance HAZY (*)    Hgb urine dipstick MODERATE (*)    Leukocytes,Ua LARGE (*)    Bacteria, UA RARE (*)    All other components within normal limits  COMPREHENSIVE METABOLIC PANEL WITH GFR - Abnormal; Notable for the following components:   Glucose, Bld 100 (*)    Albumin 3.4 (*)    All other components within normal limits  CBC  PREGNANCY, URINE    EKG None  Radiology No results found.  Procedures Procedures    Medications Ordered in ED Medications - No data to display  ED Course/ Medical Decision Making/ A&P                                 Medical Decision Making Amount and/or Complexity of Data Reviewed Labs: ordered. Radiology: ordered.   This patient is a 33 y.o. female who presents to the ED for concern of  right flank pain, this involves an extensive number of treatment options, and is a complaint that carries with it a high risk of complications and morbidity. The emergent differential diagnosis prior to evaluation includes, but is not limited to,  AAA, renal vascular thrombosis, mesenteric ischemia, pyelonephritis, nephrolithiasis, cystitis, biliary colic, pancreatitis, PUD, appendicitis, diverticulitis, bowel obstruction, Ectopic Pregnancy, PID/TOA, Ovarian cyst, Ovarian torsion  This is not an exhaustive differential.   Past Medical History / Co-morbidities / Social History:  has a past medical history of Gestational diabetes.  Additional history: Chart reviewed.   Physical Exam: Physical exam performed. The pertinent findings include: right CVA tenderness. No abdominal tenderness  Lab Tests: I ordered, and personally interpreted labs.  The pertinent results include:  UA infectious with large leukocytes, >50 RBCs, rare bacteria. No leukocytosis   Imaging Studies: I ordered imaging studies including CT renal. I independently visualized and interpreted imaging which showed   1. No nephroureterolithiasis or obstructive uropathy. 2. No acute inflammatory process identified within the abdomen or pelvis. Unremarkable appendix. 3. Multiple other nonacute observations, as described above.  I agree with the radiologist interpretation.  Disposition: After consideration of the diagnostic results and the patients response to treatment, I feel that emergency department workup does not suggest an emergent condition requiring admission or immediate intervention beyond what has been performed at this time. The plan is: Discharge with antibiotics, close outpatient follow-up and return precautions.  Patient's symptoms clinically suspicious for pyelonephritis.  Will treat for same with bactrim  given anaphylaxis to penicillins.  She has no leukocytosis and is afebrile with reassuring vital signs, no concern  for sepsis. Evaluation and diagnostic testing in the emergency department does not suggest an emergent condition requiring admission or immediate intervention beyond what has been performed at this time.  Plan for discharge with close PCP follow-up.  Patient is understanding and amenable with plan, educated on red flag symptoms that would prompt immediate return.  Patient discharged in stable condition.  Final Clinical Impression(s) / ED Diagnoses Final diagnoses:  Acute pyelonephritis    Rx / DC Orders ED Discharge Orders          Ordered    sulfamethoxazole -trimethoprim  (BACTRIM  DS) 800-160 MG tablet  2 times daily        08/19/23 1611          An After Visit Summary was printed and given  to the patient.     Fredna Jasper 08/19/23 1613    Auston Blush, MD 08/20/23 [phone]

## 2023-08-19 NOTE — Discharge Instructions (Signed)
 As we discussed, your workup in the ER today revealed that you have a kidney infection.  I have given you a prescription for antibiotics Bactrim  that you need to fill and take as prescribed in its entirety for management of this.  I recommend that you get plenty of rest and maintain adequate oral hydration.  You can take Tylenol /ibuprofen  as needed for pain.  Follow-up closely with your primary provider for continued evaluation and management of your symptoms.  Return if development of any new or worsening symptoms.

## 2023-09-10 ENCOUNTER — Ambulatory Visit: Payer: Self-pay
